# Patient Record
Sex: Female | Born: 1965 | Race: Black or African American | Hispanic: No | Marital: Single | Smoking: Never smoker
Health system: Southern US, Community
[De-identification: ages and names within clinical notes are randomized; demographics above are authoritative.]

## PROBLEM LIST (undated history)

## (undated) DIAGNOSIS — K219 Gastro-esophageal reflux disease without esophagitis: Secondary | ICD-10-CM

## (undated) DIAGNOSIS — F419 Anxiety disorder, unspecified: Secondary | ICD-10-CM

## (undated) DIAGNOSIS — E119 Type 2 diabetes mellitus without complications: Secondary | ICD-10-CM

## (undated) DIAGNOSIS — T8859XA Other complications of anesthesia, initial encounter: Secondary | ICD-10-CM

## (undated) DIAGNOSIS — G4730 Sleep apnea, unspecified: Secondary | ICD-10-CM

## (undated) DIAGNOSIS — R51 Headache: Secondary | ICD-10-CM

## (undated) DIAGNOSIS — J988 Other specified respiratory disorders: Secondary | ICD-10-CM

## (undated) DIAGNOSIS — T4145XA Adverse effect of unspecified anesthetic, initial encounter: Secondary | ICD-10-CM

## (undated) DIAGNOSIS — F99 Mental disorder, not otherwise specified: Secondary | ICD-10-CM

## (undated) DIAGNOSIS — I10 Essential (primary) hypertension: Secondary | ICD-10-CM

## (undated) HISTORY — PX: SHOULDER SURGERY: SHX246

## (undated) HISTORY — PX: DILATION AND CURETTAGE OF UTERUS: SHX78

## (undated) HISTORY — PX: ABDOMINAL HYSTERECTOMY: SHX81

---

## 2000-02-28 ENCOUNTER — Other Ambulatory Visit: Admission: RE | Admit: 2000-02-28 | Discharge: 2000-02-28 | Payer: Self-pay | Admitting: Obstetrics and Gynecology

## 2000-11-20 ENCOUNTER — Ambulatory Visit (HOSPITAL_COMMUNITY): Admission: RE | Admit: 2000-11-20 | Discharge: 2000-11-20 | Payer: Self-pay | Admitting: Family Medicine

## 2000-11-20 ENCOUNTER — Encounter: Payer: Self-pay | Admitting: Family Medicine

## 2000-12-14 ENCOUNTER — Other Ambulatory Visit: Admission: RE | Admit: 2000-12-14 | Discharge: 2000-12-14 | Payer: Self-pay | Admitting: Obstetrics & Gynecology

## 2001-04-18 ENCOUNTER — Encounter: Payer: Self-pay | Admitting: Family Medicine

## 2001-04-18 ENCOUNTER — Encounter: Admission: RE | Admit: 2001-04-18 | Discharge: 2001-04-18 | Payer: Self-pay | Admitting: Family Medicine

## 2001-09-19 ENCOUNTER — Encounter (INDEPENDENT_AMBULATORY_CARE_PROVIDER_SITE_OTHER): Payer: Self-pay | Admitting: *Deleted

## 2001-09-20 ENCOUNTER — Inpatient Hospital Stay (HOSPITAL_COMMUNITY): Admission: RE | Admit: 2001-09-20 | Discharge: 2001-09-23 | Payer: Self-pay | Admitting: Obstetrics & Gynecology

## 2001-09-20 ENCOUNTER — Encounter: Payer: Self-pay | Admitting: Obstetrics & Gynecology

## 2001-10-24 ENCOUNTER — Other Ambulatory Visit: Admission: RE | Admit: 2001-10-24 | Discharge: 2001-10-24 | Payer: Self-pay | Admitting: Obstetrics & Gynecology

## 2002-10-09 HISTORY — PX: MYOMECTOMY: SHX85

## 2003-08-14 ENCOUNTER — Other Ambulatory Visit: Admission: RE | Admit: 2003-08-14 | Discharge: 2003-08-14 | Payer: Self-pay | Admitting: Obstetrics & Gynecology

## 2004-02-26 ENCOUNTER — Ambulatory Visit (HOSPITAL_BASED_OUTPATIENT_CLINIC_OR_DEPARTMENT_OTHER): Admission: RE | Admit: 2004-02-26 | Discharge: 2004-02-26 | Payer: Self-pay | Admitting: Family Medicine

## 2004-03-21 ENCOUNTER — Encounter: Admission: RE | Admit: 2004-03-21 | Discharge: 2004-03-21 | Payer: Self-pay | Admitting: Family Medicine

## 2006-08-03 ENCOUNTER — Encounter: Admission: RE | Admit: 2006-08-03 | Discharge: 2006-08-03 | Payer: Self-pay | Admitting: Internal Medicine

## 2006-09-21 ENCOUNTER — Encounter (INDEPENDENT_AMBULATORY_CARE_PROVIDER_SITE_OTHER): Payer: Self-pay | Admitting: Specialist

## 2006-09-21 ENCOUNTER — Ambulatory Visit (HOSPITAL_COMMUNITY): Admission: RE | Admit: 2006-09-21 | Discharge: 2006-09-21 | Payer: Self-pay | Admitting: Obstetrics & Gynecology

## 2008-06-11 ENCOUNTER — Ambulatory Visit (HOSPITAL_COMMUNITY): Admission: RE | Admit: 2008-06-11 | Discharge: 2008-06-11 | Payer: Self-pay | Admitting: Obstetrics & Gynecology

## 2009-07-02 ENCOUNTER — Ambulatory Visit (HOSPITAL_COMMUNITY): Admission: RE | Admit: 2009-07-02 | Discharge: 2009-07-02 | Payer: Self-pay | Admitting: Obstetrics & Gynecology

## 2010-07-04 ENCOUNTER — Ambulatory Visit (HOSPITAL_COMMUNITY): Admission: RE | Admit: 2010-07-04 | Discharge: 2010-07-04 | Payer: Self-pay | Admitting: Obstetrics & Gynecology

## 2010-12-08 ENCOUNTER — Emergency Department (HOSPITAL_COMMUNITY): Payer: Self-pay

## 2010-12-08 ENCOUNTER — Emergency Department (HOSPITAL_COMMUNITY)
Admission: EM | Admit: 2010-12-08 | Discharge: 2010-12-09 | Disposition: A | Discharge status: Home / Self Care | Payer: Self-pay | Attending: Emergency Medicine | Admitting: Emergency Medicine

## 2010-12-08 DIAGNOSIS — F319 Bipolar disorder, unspecified: Secondary | ICD-10-CM | POA: Insufficient documentation

## 2010-12-08 DIAGNOSIS — R079 Chest pain, unspecified: Secondary | ICD-10-CM | POA: Insufficient documentation

## 2010-12-08 DIAGNOSIS — I10 Essential (primary) hypertension: Secondary | ICD-10-CM | POA: Insufficient documentation

## 2010-12-08 LAB — COMPREHENSIVE METABOLIC PANEL
ALT: 20 U/L (ref 0–35)
AST: 18 U/L (ref 0–37)
Alkaline Phosphatase: 58 U/L (ref 39–117)
CO2: 25 mEq/L (ref 19–32)
Chloride: 102 mEq/L (ref 96–112)
GFR calc Af Amer: >60 mL/min (ref >60)
GFR calc non Af Amer: >60 mL/min (ref >60)
Sodium: 136 mEq/L (ref 135–145)
Total Bilirubin: 0.4 mg/dL (ref 0.3–1.2)

## 2010-12-08 LAB — CBC
HCT: 38.2 % (ref 36.0–46.0)
Hemoglobin: 13.2 g/dL (ref 12.0–15.0)
MCH: 29.5 pg (ref 26.0–34.0)
MCHC: 34.6 g/dL (ref 30.0–36.0)

## 2010-12-08 LAB — POCT CARDIAC MARKERS

## 2011-02-24 NOTE — Discharge Summary (Signed)
[facility]  Patient:    Tracy Barry, Tracy Barry Visit Number: [id number] MRN: [id number]          Service Type: GYN Location: [id number] Attending Physician:  Genia Del Dictated by:   Genia Del, M.D. Admit Date:  09/19/2001 Discharge Date: 09/23/2001                             Discharge Summary  DATE OF BIRTH:  07-12-2066  ADMISSION DIAGNOSIS:  Symptomatic uterine myomas with menorrhagia.  DISCHARGE DIAGNOSES: 1. Symptomatic uterine myomas with menorrhagia. 2. Postoperative pulmonary edema secondary to mild adult respiratory distress    syndrome of uncertain etiology.  HOSPITAL COURSE:  The patient was admitted for surgery on September 19, 2001. She had symptomatic uterine myomas with menorrhagia.  Her hemoglobin at the time of surgery was 11.2.  She had been on Depo-Lupron x4 doses.  Endometrial biopsy was negative.  She had a Pfannenstiel incision and myomectomies were performed x3.  There was no complication at the time of surgery.  The estimated blood loss was 100 cc.  Both ovaries and tubes were normal in appearance.  The uterus was otherwise normal.  On postoperative day #1, the patient was doing very well, the pain was well controlled p.o., and she was ambulating with normal urine output.  She was afebrile with stable blood pressures, and her examination was unremarkable with bowel sounds present, and intact incision.  Hemoglobin was 10.2 postoperatively.  On that same day, later on in the afternoon, the patient was complaining of difficulty breathing.  Upon nurse exam, she was found to have rales and rhonchi, especially at the left lower lobe.  Her respiratory rate was 32, O2 saturations were only 50 to 60% on room air, and increased to 90% with 5 L of O2.  She had an M.D. evaluation, and an arterial blood gas with O2 5 L per minute was done revealing a PO2 at 46, PCO2 at 49, pH at 7.39, bicarbonate 429.3, and base excess  4.2.  O2 saturations were in the 85 to 90% range. Diagnosis of probable pulmonary edema was made, and the patient was given Lasix 20 mg IV.  Chest x-ray was requested, as well as an EKG, and the patient was transferred to the adult intensive care unit.  She responded well to Lasix, and that was repeated with a second dose at 20 mg IV.  On postoperative day #2, the patient was feeling better, but she still required 10 L per minute of O2.  She had no cardiovascular complaints, and did mention a viral upper respiratory tract syndrome that was improving recently, but had been going on for about four weeks before.  She also mentioned that she had secondary cigarette smoke at home.  On exam, her O2 saturations were 97 to 99% with oxygen, 82.8% without.  Vital signs were stable.  Lungs were showing good air entry bilaterally, but crackles throughout, and still more on the left side. The chest x-ray had shown interstitial opacities, probable mild to moderate pulmonary edema, left more than right.  EKG was normal.  Diuresis was excellent with ins and outs at -2, 5, 70 cc.  A consultation with an intensive care specialist, Dr. Sherene Sires, was requested.  The patient was seen on postoperative day #3, and confirmation of a probable pulmonary edema secondary to mild ARDS was posed.  An echocardiogram was requested which later came back negative.  On postoperative day #3, the patient was much improved, and was doing very well on postoperative day #4.  She then was tolerating room air very good with saturations at 93 to 96%.  Lungs showed no crackles anymore, and the impression was of complete resolution of postoperative pulmonary edema.  The patient was discharged home with advise, and would follow up with Dr. Sherene Sires in one week.  Postoperative visit at [facility] was planned at 3 weeks.  Tylox was prescribed p.r.n. Dictated by:   Genia Del, M.D. Attending Physician:  Genia Del DD:   10/21/01 TD:  10/22/01 Job: [id number]

## 2011-02-24 NOTE — Op Note (Signed)
[name], Tracy Barry             ACCOUNT NO.:  [id number]   MEDICAL RECORD NO.:  [id number]          PATIENT TYPE:  AMB   LOCATION:  SDC                           FACILITY:  [facility]   PHYSICIAN:  Genia Del, M.D.DATE OF BIRTH:  Oct 09, 1966   DATE OF PROCEDURE:  09/21/2006  DATE OF DISCHARGE:                               OPERATIVE REPORT   PREOPERATIVE DIAGNOSIS:  Menometrorrhagia with submucosal myoma.   POSTOP DIAGNOSIS:  Menometrorrhagia with submucosal myoma.   PROCEDURE:  Hysteroscopy resection of myoma and dilatation and  curettage.   SURGEON:  Dr. Genia Del and no assistant.   PROCEDURE:  Under general anesthesia with endotracheal intubation the  patient is in lithotomy position.  She is prepped with Betadine on the  suprapubic vulvar and vaginal areas.  The bladder was catheterized and  the patient is draped as usual.  The vaginal exam reveals an anteverted  uterus, slightly increased in volume, mobile, no adnexal masses.  The  speculum was inserted in the vagina and the anterior lip of the cervix  was grasped with a tenaculum.  A paracervical block is done at 4 o'clock  and 8 o'clock with Nesacaine 1% a total of 20 mL. The hysterometry at 9  cm. Dilatation of the cervix with Hegar dilators up to #35 without  difficulty.  We then inserted the operative hysteroscope with the double  loop. We visualize the intrauterine cavity. Two ostia are well seen and  pictures are taken.  A submucosal myoma is present on the left mid  lateral posterior aspect of the uterus.  It measured about 2.5 cm, is  resected with a double loop completely. We then proceed with the  intrauterine curettage with a sharp curette. A curettage was done  systematically on all intrauterine surfaces.  The specimen is sent  separately as endometrial curettings.  We then go back with the  hysteroscope, visualize the intrauterine cavity which now appears  normal. Pictures are taken.  Hemostasis is  adequate. Cauterization was  used at the base of the myoma to complete it. We then removed all  instruments. Hemostasis is adequate on the cervix.  The estimated blood  loss was minimal.  The fluid deficit was 175 mL.  No complications  occurred and the patient was brought to recovery room in good stable  status.      Genia Del, M.D.  Electronically Signed     ML/MEDQ  D:  09/21/2006  T:  09/21/2006  Job:  [id number]

## 2011-02-24 NOTE — Op Note (Signed)
[facility]  Patient:    Tracy Barry, Tracy Barry Visit Number: [id number] MRN: [id number]          Service Type: DSU Location: 9300 [id number] Attending Physician:  Genia Del Dictated by:   Genia Del, M.D. Proc. Date: 09/19/01 Admit Date:  09/19/2001                             Operative Report  DATE OF BIRTH:                [birth year]  PREOPERATIVE DIAGNOSIS:       Symptomatic uterine myoma with menorrhagia,                               desires preserved fertility.  POSTOPERATIVE DIAGNOSIS:      Symptomatic uterine myoma with menorrhagia,                               desires preserved fertility.  OPERATION:                    Myomectomies x 3 by laparotomy.  SURGEON:                      Genia Del, M.D.  ASSISTANT:                    Lenoard Aden, M.D.  ANESTHESIOLOGIST:             Dr. _________.  ANESTHESIA:                   General.  DESCRIPTION OF PROCEDURE:     Under general anesthesia with endotracheal intubation with the patient in the decubitus dorsal position.  She is prepped with Betadine in the abdominal, suprapubic, vulvar, and vaginal area.  The bladder catheter was inserted and the patient is draped as usual.  A Pfannenstiel incision was performed with a scalpel.  The aponeurosis was opened transversely with the Mayo scissors.  The parietal peritoneum was opened longitudinally.  We then explored the pelvic cavity.  The uterus showed an anterior fundal intramural myoma measuring about 5 cm.  A posterior pedunculated subserosal myoma measuring about 2 cm is present as well as a third myoma on the left anterior uterus, pedunculated and subserosal myoma measuring about 2.5 cm.  The bowels are retracted upward with laps and the Balfour retractor is inserted.  We injected the vasopressin 1:200 at the base of the pedunculated myoma.  We then excised them by electrocauterization at the base.   Hemostasis is adequate at both levels.  We then injected vasopressin 1:200 at the level of the anterior fundus of the uterus.  We proceeded with an incision at this level with the electrocautery.  We then proceeded with excision of the intramural myoma using the Metzenbaum scissors to dissect until we reached the base of the myoma.  At that level, the Masterson clamp is used to clamp the vessel, irrigating the myoma.  We then sutured with a Vicryl 0.  The three myomas are sent to pathology.  The uterine cavity was not entered, but the myometrium was completely incised down to the endometrium.  We then closed the base with interrupted Vicryl 0. Hemostasis was adequate at that level.  We then closed the superficial part of the myometrium with a baseball-type suture, inverting the suture line with a Monocryl 2-0.  We confirmed hemostasis at all levels.  An additional suture with Monocryl 2-0 is applied at the fundus to complete hemostasis.  We then applied Interceed at all levels of myomectomies of the uterus.  Both ovaries were normal in appearance and size.  Both tubes were normal in appearance and size.  A small paratubal cyst was removed on the left side.  The Interceed was applied after irrigating the pelvic cavity and suctioning.  We then removed the laps and the retractor.  We verified hemostasis at the level of the recti muscles.  It is adequate.  We closed the aponeurosis by two half running sutures with Vicryl 0.  Hemostasis at the level of the adipose tissue was completed with the electrocautery.  The skin is reapproximated with staples.  Note, that the skin was infiltrated with Marcaine 0.25% at the beginning of the surgery, 10 cc.  A dry dressing was applied.  The estimated blood loss was 100 cc.  Count of instruments and sponges was complete x 2.  No complications occurred and the patient was sent to the recovery room in good status. Dictated by:   Genia Del,  M.D. Attending Physician:  Genia Del DD:  09/19/01 TD:  09/19/01 Job: [id number]

## 2011-06-23 ENCOUNTER — Other Ambulatory Visit (HOSPITAL_COMMUNITY): Payer: Self-pay | Admitting: Obstetrics & Gynecology

## 2011-06-23 DIAGNOSIS — Z1231 Encounter for screening mammogram for malignant neoplasm of breast: Secondary | ICD-10-CM

## 2011-07-07 ENCOUNTER — Ambulatory Visit (HOSPITAL_COMMUNITY)
Admission: RE | Admit: 2011-07-07 | Discharge: 2011-07-07 | Disposition: A | Discharge status: Home / Self Care | Payer: 59 | Source: Ambulatory Visit | Attending: Obstetrics & Gynecology | Admitting: Obstetrics & Gynecology

## 2011-07-07 DIAGNOSIS — Z1231 Encounter for screening mammogram for malignant neoplasm of breast: Secondary | ICD-10-CM

## 2012-06-11 ENCOUNTER — Other Ambulatory Visit (HOSPITAL_COMMUNITY): Payer: Self-pay | Admitting: Obstetrics & Gynecology

## 2012-06-11 DIAGNOSIS — Z1231 Encounter for screening mammogram for malignant neoplasm of breast: Secondary | ICD-10-CM

## 2012-07-08 ENCOUNTER — Ambulatory Visit (HOSPITAL_COMMUNITY)
Admission: RE | Admit: 2012-07-08 | Discharge: 2012-07-08 | Disposition: A | Discharge status: Home / Self Care | Payer: 59 | Source: Ambulatory Visit | Attending: Obstetrics & Gynecology | Admitting: Obstetrics & Gynecology

## 2012-07-08 DIAGNOSIS — Z1231 Encounter for screening mammogram for malignant neoplasm of breast: Secondary | ICD-10-CM | POA: Insufficient documentation

## 2012-09-20 ENCOUNTER — Encounter: Admission: RE | Payer: Self-pay | Source: Ambulatory Visit

## 2012-09-20 ENCOUNTER — Ambulatory Visit: Admission: RE | Admit: 2012-09-20 | Payer: 59 | Source: Ambulatory Visit | Admitting: Obstetrics & Gynecology

## 2012-09-20 SURGERY — ROBOTIC ASSISTED TOTAL HYSTERECTOMY
Anesthesia: General

## 2012-10-11 ENCOUNTER — Encounter (HOSPITAL_COMMUNITY): Payer: Self-pay | Admitting: Pharmacist

## 2012-10-11 ENCOUNTER — Other Ambulatory Visit: Payer: Self-pay | Admitting: Obstetrics & Gynecology

## 2012-10-14 ENCOUNTER — Encounter (HOSPITAL_COMMUNITY)
Admission: RE | Admit: 2012-10-14 | Discharge: 2012-10-14 | Disposition: A | Discharge status: Home / Self Care | Payer: Managed Care, Other (non HMO) | Source: Ambulatory Visit | Attending: Obstetrics & Gynecology | Admitting: Obstetrics & Gynecology

## 2012-10-14 ENCOUNTER — Other Ambulatory Visit: Payer: Self-pay

## 2012-10-14 ENCOUNTER — Encounter (HOSPITAL_COMMUNITY): Payer: Self-pay

## 2012-10-14 HISTORY — DX: Adverse effect of unspecified anesthetic, initial encounter: T41.45XA

## 2012-10-14 HISTORY — DX: Headache: R51

## 2012-10-14 HISTORY — DX: Other complications of anesthesia, initial encounter: T88.59XA

## 2012-10-14 HISTORY — DX: Sleep apnea, unspecified: G47.30

## 2012-10-14 HISTORY — DX: Essential (primary) hypertension: I10

## 2012-10-14 HISTORY — DX: Anxiety disorder, unspecified: F41.9

## 2012-10-14 HISTORY — DX: Type 2 diabetes mellitus without complications: E11.9

## 2012-10-14 HISTORY — DX: Gastro-esophageal reflux disease without esophagitis: K21.9

## 2012-10-14 HISTORY — DX: Other specified respiratory disorders: J98.8

## 2012-10-14 LAB — BASIC METABOLIC PANEL
Calcium: 9.8 mg/dL (ref 8.4–10.5)
Creatinine, Ser: 0.82 mg/dL (ref 0.50–1.10)
GFR calc Af Amer: >90 mL/min (ref >90)

## 2012-10-14 LAB — CBC
MCH: 29.2 pg (ref 26.0–34.0)
MCV: 90.8 fL (ref 78.0–100.0)
Platelets: 304 10*3/uL (ref 150–400)
RDW: 14.4 % (ref 11.5–15.5)

## 2012-10-14 NOTE — Progress Notes (Signed)
EKG performed for per preop protocol, Dr Rodman Pickle shown with old EKG for comparison, no new orders received at present. Pt also requesting to speak with anesthesiologist for questions, Dr Rodman Pickle in to see pt and discuss upcoming surgery. Cleared for surgery by Dr Rodman Pickle.

## 2012-10-14 NOTE — Patient Instructions (Addendum)
GENERAL PRE-OPERATIVE PATIENT INSTRUCTIONS   Your procedure is scheduled on: Thursday, January 9th  Enter through the Main Entrance of [facility] at:6:00am Pick up the phone at the desk and dial [phone] and inform us of your arrival.  Please call this number if you have any problems the morning of surgery: [phone]  Take your Hydrochlorthiazide and Lamotrigine, clonazepam with sips of water morning of surgery. Hold your Metformin Wednesday evening dose and Thursday morning dose Remember: Do not eat or drink anything after midnight on Wednesday, January 8th  Do not wear jewelry, make-up, or FINGER nail polish No metal in your hair or on your body. Do not wear lotions, powders, perfumes. You may wear deodorant. Please use your CHG wash as directed prior to surgery. Do not shave anywhere for at least 12 hours prior to first CHG shower. Do not bring valuables to the hospital.  Leave suitcase in the car.  After Surgery it may be brought to your room. For patients being admitted to the hospital, checkout time is 11:00am the day of discharge.   Patient signature______________________________________________________________

## 2012-10-17 ENCOUNTER — Ambulatory Visit (HOSPITAL_COMMUNITY)
Admission: RE | Admit: 2012-10-17 | Discharge: 2012-10-18 | Disposition: A | Discharge status: Home / Self Care | Payer: Managed Care, Other (non HMO) | Source: Ambulatory Visit | Attending: Obstetrics & Gynecology | Admitting: Obstetrics & Gynecology

## 2012-10-17 ENCOUNTER — Encounter (HOSPITAL_COMMUNITY): Payer: Self-pay | Admitting: Anesthesiology

## 2012-10-17 ENCOUNTER — Encounter (HOSPITAL_COMMUNITY): Payer: Self-pay

## 2012-10-17 ENCOUNTER — Ambulatory Visit (HOSPITAL_COMMUNITY): Payer: Managed Care, Other (non HMO) | Admitting: Anesthesiology

## 2012-10-17 ENCOUNTER — Encounter (HOSPITAL_COMMUNITY)
Admission: RE | Disposition: A | Discharge status: Home / Self Care | Payer: Self-pay | Source: Ambulatory Visit | Attending: Obstetrics & Gynecology

## 2012-10-17 DIAGNOSIS — Z90710 Acquired absence of both cervix and uterus: Secondary | ICD-10-CM | POA: Diagnosis present

## 2012-10-17 DIAGNOSIS — Z01818 Encounter for other preprocedural examination: Secondary | ICD-10-CM | POA: Insufficient documentation

## 2012-10-17 DIAGNOSIS — N736 Female pelvic peritoneal adhesions (postinfective): Secondary | ICD-10-CM | POA: Insufficient documentation

## 2012-10-17 DIAGNOSIS — Z01812 Encounter for preprocedural laboratory examination: Secondary | ICD-10-CM | POA: Insufficient documentation

## 2012-10-17 DIAGNOSIS — D252 Subserosal leiomyoma of uterus: Secondary | ICD-10-CM | POA: Insufficient documentation

## 2012-10-17 DIAGNOSIS — N920 Excessive and frequent menstruation with regular cycle: Secondary | ICD-10-CM | POA: Insufficient documentation

## 2012-10-17 HISTORY — PX: ROBOTIC ASSISTED TOTAL HYSTERECTOMY: SHX6085

## 2012-10-17 LAB — CBC
Hemoglobin: 11.5 g/dL — ABNORMAL LOW (ref 12.0–15.0)
MCH: 29.9 pg (ref 26.0–34.0)
MCHC: 33.6 g/dL (ref 30.0–36.0)
Platelets: 277 10*3/uL (ref 150–400)
RDW: 14.1 % (ref 11.5–15.5)

## 2012-10-17 LAB — MRSA CULTURE

## 2012-10-17 LAB — TYPE AND SCREEN
ABO/RH(D): A POS
Antibody Screen: NEGATIVE

## 2012-10-17 LAB — GLUCOSE, CAPILLARY
Glucose-Capillary: 102 mg/dL — ABNORMAL HIGH (ref 70–99)
Glucose-Capillary: 94 mg/dL (ref 70–99)

## 2012-10-17 LAB — ABO/RH: ABO/RH(D): A POS

## 2012-10-17 SURGERY — ROBOTIC ASSISTED TOTAL HYSTERECTOMY
Anesthesia: General | Site: Abdomen | Wound class: Clean Contaminated

## 2012-10-17 MED ORDER — FENTANYL CITRATE 0.05 MG/ML IJ SOLN
INTRAMUSCULAR | Status: AC
  Filled 2012-10-17: qty 5

## 2012-10-17 MED ORDER — BUPIVACAINE HCL (PF) 0.25 % IJ SOLN
INTRAMUSCULAR | Status: DC | PRN
  Administered 2012-10-17: 19 mL

## 2012-10-17 MED ORDER — NEOSTIGMINE METHYLSULFATE 1 MG/ML IJ SOLN
INTRAMUSCULAR | Status: DC | PRN
  Administered 2012-10-17: 3 mg via INTRAVENOUS

## 2012-10-17 MED ORDER — ACETAMINOPHEN 10 MG/ML IV SOLN
INTRAVENOUS | Status: AC
  Filled 2012-10-17: qty 100

## 2012-10-17 MED ORDER — OXYCODONE-ACETAMINOPHEN 5-325 MG PO TABS
1.0000 | ORAL_TABLET | ORAL | Status: DC | PRN
  Administered 2012-10-17: 2 via ORAL
  Administered 2012-10-18: 1 via ORAL
  Administered 2012-10-18 (×2): 2 via ORAL
  Filled 2012-10-17: qty 2
  Filled 2012-10-17: qty 1
  Filled 2012-10-17 (×2): qty 2

## 2012-10-17 MED ORDER — METFORMIN HCL 500 MG PO TABS
500.0000 mg | ORAL_TABLET | Freq: Two times a day (BID) | ORAL | Status: DC
Start: 2012-10-17 — End: 2012-10-18
  Administered 2012-10-17 – 2012-10-18 (×2): 500 mg via ORAL
  Filled 2012-10-17 (×2): qty 1

## 2012-10-17 MED ORDER — GLYCOPYRROLATE 0.2 MG/ML IJ SOLN
INTRAMUSCULAR | Status: DC | PRN
  Administered 2012-10-17: 0.4 mg via INTRAVENOUS

## 2012-10-17 MED ORDER — OXYCODONE-ACETAMINOPHEN 7.5-325 MG PO TABS: 1.0000 | ORAL_TABLET | Freq: Four times a day (QID) | ORAL | Status: DC | PRN

## 2012-10-17 MED ORDER — LIDOCAINE HCL (CARDIAC) 20 MG/ML IV SOLN
INTRAVENOUS | Status: AC
  Filled 2012-10-17: qty 5

## 2012-10-17 MED ORDER — LABETALOL HCL 5 MG/ML IV SOLN
INTRAVENOUS | Status: DC | PRN
  Administered 2012-10-17 (×2): 5 mg via INTRAVENOUS

## 2012-10-17 MED ORDER — ONDANSETRON HCL 4 MG/2ML IJ SOLN
INTRAMUSCULAR | Status: DC | PRN
  Administered 2012-10-17: 4 mg via INTRAVENOUS

## 2012-10-17 MED ORDER — PROPOFOL 10 MG/ML IV EMUL
INTRAVENOUS | Status: AC
  Filled 2012-10-17: qty 20

## 2012-10-17 MED ORDER — CLONAZEPAM 0.5 MG PO TABS
1.0000 mg | ORAL_TABLET | Freq: Every day | ORAL | Status: DC
  Administered 2012-10-18: 1 mg via ORAL
  Filled 2012-10-17: qty 2

## 2012-10-17 MED ORDER — MEPERIDINE HCL 25 MG/ML IJ SOLN: 6.2500 mg | INTRAMUSCULAR | Status: DC | PRN

## 2012-10-17 MED ORDER — HYDROMORPHONE HCL PF 1 MG/ML IJ SOLN
INTRAMUSCULAR | Status: AC
  Filled 2012-10-17: qty 1

## 2012-10-17 MED ORDER — CEFOXITIN SODIUM 2 G IV SOLR
2.0000 g | Freq: Once | INTRAVENOUS | Status: AC
  Administered 2012-10-17: 2 g via INTRAVENOUS
  Filled 2012-10-17: qty 2

## 2012-10-17 MED ORDER — HYDROCHLOROTHIAZIDE 12.5 MG PO CAPS
12.5000 mg | ORAL_CAPSULE | Freq: Every day | ORAL | Status: DC
  Filled 2012-10-17: qty 1

## 2012-10-17 MED ORDER — FENTANYL CITRATE 0.05 MG/ML IJ SOLN
INTRAMUSCULAR | Status: AC
  Filled 2012-10-17: qty 2

## 2012-10-17 MED ORDER — ROCURONIUM BROMIDE 100 MG/10ML IV SOLN
INTRAVENOUS | Status: DC | PRN
  Administered 2012-10-17: 20 mg via INTRAVENOUS
  Administered 2012-10-17: 5 mg via INTRAVENOUS
  Administered 2012-10-17: 50 mg via INTRAVENOUS
  Administered 2012-10-17 (×2): 20 mg via INTRAVENOUS
  Administered 2012-10-17: 10 mg via INTRAVENOUS

## 2012-10-17 MED ORDER — FENTANYL CITRATE 0.05 MG/ML IJ SOLN
INTRAMUSCULAR | Status: DC | PRN
  Administered 2012-10-17: 100 ug via INTRAVENOUS
  Administered 2012-10-17 (×4): 50 ug via INTRAVENOUS
  Administered 2012-10-17: 100 ug via INTRAVENOUS

## 2012-10-17 MED ORDER — HYDROMORPHONE HCL PF 1 MG/ML IJ SOLN
1.0000 mg | INTRAMUSCULAR | Status: DC | PRN
  Administered 2012-10-17 – 2012-10-18 (×2): 1 mg via INTRAVENOUS
  Filled 2012-10-17 (×2): qty 1

## 2012-10-17 MED ORDER — BUPIVACAINE HCL (PF) 0.25 % IJ SOLN
INTRAMUSCULAR | Status: AC
  Filled 2012-10-17: qty 30

## 2012-10-17 MED ORDER — ONDANSETRON HCL 4 MG/2ML IJ SOLN
INTRAMUSCULAR | Status: AC
  Filled 2012-10-17: qty 2

## 2012-10-17 MED ORDER — NEOSTIGMINE METHYLSULFATE 1 MG/ML IJ SOLN
INTRAMUSCULAR | Status: AC
  Filled 2012-10-17: qty 1

## 2012-10-17 MED ORDER — MIDAZOLAM HCL 2 MG/2ML IJ SOLN
INTRAMUSCULAR | Status: AC
  Filled 2012-10-17: qty 2

## 2012-10-17 MED ORDER — ARTIFICIAL TEARS OP OINT
TOPICAL_OINTMENT | OPHTHALMIC | Status: AC
  Filled 2012-10-17: qty 3.5

## 2012-10-17 MED ORDER — FENTANYL CITRATE 0.05 MG/ML IJ SOLN
25.0000 ug | INTRAMUSCULAR | Status: DC | PRN
  Administered 2012-10-17 (×5): 50 ug via INTRAVENOUS

## 2012-10-17 MED ORDER — KETOROLAC TROMETHAMINE 30 MG/ML IJ SOLN
INTRAMUSCULAR | Status: AC
  Administered 2012-10-17: 30 mg via INTRAVENOUS
  Filled 2012-10-17: qty 1

## 2012-10-17 MED ORDER — LACTATED RINGERS IR SOLN
Status: DC | PRN
  Administered 2012-10-17: 3000 mL

## 2012-10-17 MED ORDER — IBUPROFEN 600 MG PO TABS
600.0000 mg | ORAL_TABLET | Freq: Four times a day (QID) | ORAL | Status: DC | PRN
  Administered 2012-10-18: 600 mg via ORAL
  Filled 2012-10-17: qty 1

## 2012-10-17 MED ORDER — LACTATED RINGERS IV SOLN
INTRAVENOUS | Status: DC
  Administered 2012-10-17 – 2012-10-18 (×2): via INTRAVENOUS

## 2012-10-17 MED ORDER — MIDAZOLAM HCL 5 MG/5ML IJ SOLN
INTRAMUSCULAR | Status: DC | PRN
  Administered 2012-10-17: 2 mg via INTRAVENOUS

## 2012-10-17 MED ORDER — LABETALOL HCL 5 MG/ML IV SOLN
INTRAVENOUS | Status: AC
  Filled 2012-10-17: qty 4

## 2012-10-17 MED ORDER — LACTATED RINGERS IV SOLN
INTRAVENOUS | Status: DC
  Administered 2012-10-17: 09:00:00 via INTRAVENOUS
  Administered 2012-10-17: 50 mL/h via INTRAVENOUS
  Administered 2012-10-17: 15:00:00 via INTRAVENOUS

## 2012-10-17 MED ORDER — GLYCOPYRROLATE 0.2 MG/ML IJ SOLN
INTRAMUSCULAR | Status: AC
  Filled 2012-10-17: qty 2

## 2012-10-17 MED ORDER — PROPOFOL 10 MG/ML IV EMUL
INTRAVENOUS | Status: DC | PRN
  Administered 2012-10-17: 200 mg via INTRAVENOUS
  Administered 2012-10-17 (×4): 50 mg via INTRAVENOUS

## 2012-10-17 MED ORDER — LIDOCAINE HCL (CARDIAC) 20 MG/ML IV SOLN
INTRAVENOUS | Status: DC | PRN
  Administered 2012-10-17: 80 mg via INTRAVENOUS

## 2012-10-17 MED ORDER — HYDROMORPHONE HCL PF 1 MG/ML IJ SOLN
INTRAMUSCULAR | Status: DC | PRN
  Administered 2012-10-17: 1 mg via INTRAVENOUS

## 2012-10-17 MED ORDER — ONDANSETRON HCL 4 MG/2ML IJ SOLN: 4.0000 mg | Freq: Once | INTRAMUSCULAR | Status: DC | PRN

## 2012-10-17 MED ORDER — ROCURONIUM BROMIDE 50 MG/5ML IV SOLN
INTRAVENOUS | Status: AC
  Filled 2012-10-17: qty 1

## 2012-10-17 MED ORDER — LAMOTRIGINE 100 MG PO TABS
150.0000 mg | ORAL_TABLET | Freq: Two times a day (BID) | ORAL | Status: DC
  Administered 2012-10-17 – 2012-10-18 (×2): 150 mg via ORAL
  Filled 2012-10-17 (×2): qty 1.5

## 2012-10-17 MED ORDER — KETOROLAC TROMETHAMINE 30 MG/ML IJ SOLN
15.0000 mg | Freq: Once | INTRAMUSCULAR | Status: AC | PRN
  Administered 2012-10-17: 30 mg via INTRAVENOUS

## 2012-10-17 MED ORDER — STERILE WATER FOR IRRIGATION IR SOLN
Status: DC | PRN
  Administered 2012-10-17: 1000 mL via INTRAVESICAL

## 2012-10-17 MED ORDER — ACETAMINOPHEN 10 MG/ML IV SOLN
1000.0000 mg | Freq: Four times a day (QID) | INTRAVENOUS | Status: DC
  Administered 2012-10-17: 1000 mg via INTRAVENOUS

## 2012-10-17 SURGICAL SUPPLY — 65 items
ADH SKN CLS APL DERMABOND .7 (GAUZE/BANDAGES/DRESSINGS) ×1
BAG URINE DRAINAGE (UROLOGICAL SUPPLIES) ×2 IMPLANT
BARRIER ADHS 3X4 INTERCEED (GAUZE/BANDAGES/DRESSINGS) ×2 IMPLANT
BLADE LAPAROSCOPIC MORCELL KIT (BLADE) IMPLANT
BRR ADH 4X3 ABS CNTRL BYND (GAUZE/BANDAGES/DRESSINGS) ×1
CATH FOLEY 3WAY  5CC 16FR (CATHETERS) ×1
CATH FOLEY 3WAY 5CC 16FR (CATHETERS) ×1 IMPLANT
CLOTH BEACON ORANGE TIMEOUT ST (SAFETY) ×2 IMPLANT
CONT PATH 16OZ SNAP LID 3702 (MISCELLANEOUS) ×2 IMPLANT
COVER MAYO STAND STRL (DRAPES) ×2 IMPLANT
COVER TABLE BACK 60X90 (DRAPES) ×4 IMPLANT
COVER TIP SHEARS 8 DVNC (MISCELLANEOUS) ×1 IMPLANT
COVER TIP SHEARS 8MM DA VINCI (MISCELLANEOUS) ×2
DECANTER SPIKE VIAL GLASS SM (MISCELLANEOUS) ×1 IMPLANT
DERMABOND ADVANCED (GAUZE/BANDAGES/DRESSINGS) ×1
DERMABOND ADVANCED .7 DNX12 (GAUZE/BANDAGES/DRESSINGS) ×2 IMPLANT
DRAPE HUG U DISPOSABLE (DRAPE) ×2 IMPLANT
DRAPE LG THREE QUARTER DISP (DRAPES) ×4 IMPLANT
DRAPE WARM FLUID 44X44 (DRAPE) ×2 IMPLANT
ELECT REM PT RETURN 9FT ADLT (ELECTROSURGICAL) ×2
ELECTRODE REM PT RTRN 9FT ADLT (ELECTROSURGICAL) ×1 IMPLANT
EVACUATOR SMOKE 8.L (FILTER) ×2 IMPLANT
GAUZE VASELINE 3X9 (GAUZE/BANDAGES/DRESSINGS) IMPLANT
GLOVE BIO SURGEON STRL SZ 6.5 (GLOVE) ×5 IMPLANT
GLOVE BIO SURGEON STRL SZ7 (GLOVE) ×4 IMPLANT
GLOVE BIOGEL PI IND STRL 7.0 (GLOVE) ×1 IMPLANT
GLOVE BIOGEL PI INDICATOR 7.0 (GLOVE) ×1
GOWN STRL REIN XL XLG (GOWN DISPOSABLE) ×12 IMPLANT
IV STOPCOCK 4 WAY 40  W/Y SET (IV SOLUTION)
IV STOPCOCK 4 WAY 40 W/Y SET (IV SOLUTION) IMPLANT
KIT ACCESSORY DA VINCI DISP (KITS) ×1
KIT ACCESSORY DVNC DISP (KITS) ×1 IMPLANT
LEGGING LITHOTOMY PAIR STRL (DRAPES) ×2 IMPLANT
NEEDLE HYPO 22GX1.5 SAFETY (NEEDLE) IMPLANT
OCCLUDER COLPOPNEUMO (BALLOONS) ×1 IMPLANT
PACK LAVH (CUSTOM PROCEDURE TRAY) ×2 IMPLANT
PAD PREP 24X48 CUFFED NSTRL (MISCELLANEOUS) ×4 IMPLANT
PLUG CATH AND CAP STER (CATHETERS) ×2 IMPLANT
PROTECTOR NERVE ULNAR (MISCELLANEOUS) ×4 IMPLANT
SET CYSTO W/LG BORE CLAMP LF (SET/KITS/TRAYS/PACK) IMPLANT
SET IRRIG TUBING LAPAROSCOPIC (IRRIGATION / IRRIGATOR) ×4 IMPLANT
SOLUTION ELECTROLUBE (MISCELLANEOUS) ×2 IMPLANT
SUT VIC AB 0 CT1 27 (SUTURE) ×10
SUT VIC AB 0 CT1 27XBRD ANTBC (SUTURE) ×5 IMPLANT
SUT VIC AB 4-0 PS2 27 (SUTURE) ×4 IMPLANT
SUT VICRYL 0 27 CT2 27 ABS (SUTURE) IMPLANT
SUT VICRYL 0 UR6 27IN ABS (SUTURE) ×4 IMPLANT
SUT VLOC 180 0 9IN  GS21 (SUTURE) ×1
SUT VLOC 180 0 9IN GS21 (SUTURE) IMPLANT
SYR 50ML LL SCALE MARK (SYRINGE) ×2 IMPLANT
SYSTEM CONVERTIBLE TROCAR (TROCAR) IMPLANT
TIP RUMI ORANGE 6.7MMX12CM (TIP) IMPLANT
TIP UTERINE 5.1X6CM LAV DISP (MISCELLANEOUS) IMPLANT
TIP UTERINE 6.7X10CM GRN DISP (MISCELLANEOUS) IMPLANT
TIP UTERINE 6.7X6CM WHT DISP (MISCELLANEOUS) IMPLANT
TIP UTERINE 6.7X8CM BLUE DISP (MISCELLANEOUS) ×1 IMPLANT
TOWEL OR 17X24 6PK STRL BLUE (TOWEL DISPOSABLE) ×6 IMPLANT
TROCAR 12M 150ML BLUNT (TROCAR) ×1 IMPLANT
TROCAR DISP BLADELESS 8 DVNC (TROCAR) ×1 IMPLANT
TROCAR DISP BLADELESS 8MM (TROCAR) ×2
TROCAR XCEL 12X100 BLDLESS (ENDOMECHANICALS) ×2 IMPLANT
TROCAR XCEL NON-BLD 5MMX100MML (ENDOMECHANICALS) ×2 IMPLANT
TUBING FILTER THERMOFLATOR (ELECTROSURGICAL) ×3 IMPLANT
WARMER LAPAROSCOPE (MISCELLANEOUS) ×2 IMPLANT
WATER STERILE IRR 1000ML POUR (IV SOLUTION) ×6 IMPLANT

## 2012-10-17 NOTE — Anesthesia Preprocedure Evaluation (Addendum)
Anesthesia Evaluation  Patient identified by MRN, date of birth, ID band Patient awake    Reviewed: Allergy & Precautions, H&P , NPO status , Patient's Chart, lab work & pertinent test results  Airway Mallampati: II TM Distance: >3 FB Neck ROM: full    Dental No notable dental hx. (+) Teeth Intact   Pulmonary  OSA dx in the "past". Does not know her #. Does not have her mask. Had problems with SaO2 after surgery   Pulmonary exam normal       Cardiovascular hypertension, Pt. on medications     Neuro/Psych negative neurological ROS  negative psych ROS   GI/Hepatic Neg liver ROS,   Endo/Other  diabetes, Type obesity  Renal/GU negative Renal ROS     Musculoskeletal negative musculoskeletal ROS (+)   Abdominal (+) + obese,   Peds  Hematology negative hematology ROS (+)   Anesthesia Other Findings   Reproductive/Obstetrics negative OB ROS                           Anesthesia Physical Anesthesia Plan  ASA: III  Anesthesia Plan: General   Post-op Pain Management:    Induction: Intravenous  Airway Management Planned: Oral ETT  Additional Equipment:   Intra-op Plan:   Post-operative Plan: Extubation in OR  Informed Consent: I have reviewed the patients History and Physical, chart, labs and discussed the procedure including the risks, benefits and alternatives for the proposed anesthesia with the patient or authorized representative who has indicated his/her understanding and acceptance.   Dental Advisory Given  Plan Discussed with: CRNA and Surgeon  Anesthesia Plan Comments: (1. Would keep this patient overnight because of comorbidities and dx of OSA.)        Anesthesia Quick Evaluation

## 2012-10-17 NOTE — Discharge Summary (Signed)
  Physician Discharge Summary  Patient ID: Tracy Barry MRN: [id number] DOB/AGE: 1966-06-01 46 y.o.  Admit date: 10/17/2012 Discharge date: 10/17/2012  Admission Diagnoses: Menorrhagia, Uterine Fibroids  Discharge Diagnoses: Menorrhagia, Uterine Fibroids        Active Problems:  * No active hospital problems. *    Discharged Condition: good  Hospital Course: Outpatient  Consults: None  Treatments: surgery: TLH da Vinci with extensive lysis of adhesions  Disposition: 01-Home or Self Care     Medication List     As of 10/17/2012  2:30 PM    ASK your doctor about these medications         ALKA-SELTZER PLUS COLD PO   Take 1 tablet by mouth daily as needed. For cold symptoms      clonazePAM 1 MG tablet   Commonly known as: KLONOPIN   Take 1 mg by mouth daily.      estazolam 2 MG tablet   Commonly known as: PROSOM   Take 2 mg by mouth at bedtime as needed. For sleep. Takes an hour after trazodone if not effective.      hydrochlorothiazide 12.5 MG capsule   Commonly known as: MICROZIDE   Take 12.5 mg by mouth daily.      lamoTRIgine 150 MG tablet   Commonly known as: LAMICTAL   Take 150 mg by mouth 2 (two) times daily.      metFORMIN 500 MG tablet   Commonly known as: GLUCOPHAGE   Take 500 mg by mouth 2 (two) times daily with a meal.      oxyCODONE-acetaminophen 5-325 MG per tablet   Commonly known as: PERCOCET/ROXICET   Take 1 tablet by mouth every 4 (four) hours as needed. For menstrual pain      traZODone 150 MG tablet   Commonly known as: DESYREL   Take 150 mg by mouth at bedtime.           Follow-up Information    Follow up with Brandan Robicheaux,MARIE-LYNE, MD. In 3 weeks.   Contact information:   [address] [place] [address] [phone]          Signed: Genia Del, MD 10/17/2012, 2:30 PM

## 2012-10-17 NOTE — H&P (Signed)
Tracy Barry is an 47 y.o. female  P0  RP:  Menorrhagia with myomas for TLH robotic  Pertinent Gynecological History: Menses: flow is excessive with use of many pads or tampons on heaviest days Contraception: abstinence Blood transfusions: none Sexually transmitted diseases: no past history  Last mammogram: normal  Last pap: normal OB History:  Sp. Abs, P0   Menstrual History:  No LMP recorded.    Past Medical History  Diagnosis Date  . Hypertension   . Complication of anesthesia     decreased sa02 after anesthesia  . Anxiety   . Diabetes mellitus without complication   . Sleep apnea   . Headache   . GERD (gastroesophageal reflux disease)     does not take medication to treat  . Congestion of upper airway     Past Surgical History  Procedure Date  . Myomectomy 2004  . Dilation and curettage of uterus   . Shoulder surgery     No family history on file.  Social History:  reports that she has never smoked. She does not have any smokeless tobacco history on file. She reports that she does not drink alcohol or use illicit drugs.  Allergies: No Known Allergies  Prescriptions prior to admission  Medication Sig Dispense Refill  . Chlorphen-Phenyleph-ASA (ALKA-SELTZER PLUS COLD PO) Take 1 tablet by mouth daily as needed. For cold symptoms      . clonazePAM (KLONOPIN) 1 MG tablet Take 1 mg by mouth daily.      Marland Kitchen estazolam (PROSOM) 2 MG tablet Take 2 mg by mouth at bedtime as needed. For sleep. Takes an hour after trazodone if not effective.      . hydrochlorothiazide (MICROZIDE) 12.5 MG capsule Take 12.5 mg by mouth daily.      Marland Kitchen lamoTRIgine (LAMICTAL) 150 MG tablet Take 150 mg by mouth 2 (two) times daily.      . metFORMIN (GLUCOPHAGE) 500 MG tablet Take 500 mg by mouth 2 (two) times daily with a meal.      . oxyCODONE-acetaminophen (PERCOCET/ROXICET) 5-325 MG per tablet Take 1 tablet by mouth every 4 (four) hours as needed. For menstrual pain      . traZODone  (DESYREL) 150 MG tablet Take 150 mg by mouth at bedtime.         Blood pressure 134/84, pulse 84, temperature 97.9 F (36.6 C), temperature source Oral, resp. rate 18, SpO2 98.00%.   Results for orders placed during the hospital encounter of 10/17/12 (from the past 24 hour(s))  GLUCOSE, CAPILLARY     Status: Normal   Collection Time   10/17/12  7:07 AM      Component Value Range   Glucose-Capillary 91  70 - 99 mg/dL    No results found.  Assessment/Plan: Menorrhagia secondary to myomas for TLH da Vinci.  Surgery and risks reviewed.  Maralee Higuchi,MARIE-LYNE 10/17/2012, 8:39 AM

## 2012-10-17 NOTE — Anesthesia Postprocedure Evaluation (Signed)
Anesthesia Post Note  Patient: Tracy Barry  Procedure(s) Performed: Procedure(s) (LRB): ROBOTIC ASSISTED TOTAL HYSTERECTOMY (N/A)  Anesthesia type: General  Patient location: PACU  Post pain: Pain level controlled  Post assessment: Post-op Vital signs reviewed  Last Vitals:  Filed Vitals:   10/17/12 1545  BP: 144/82  Pulse: 87  Temp:   Resp: 18    Post vital signs: Reviewed  Level of consciousness: sedated  Complications: No apparent anesthesia complications

## 2012-10-17 NOTE — Transfer of Care (Signed)
Immediate Anesthesia Transfer of Care Note  Patient: Tracy Barry  Procedure(s) Performed: Procedure(s) (LRB) with comments: ROBOTIC ASSISTED TOTAL HYSTERECTOMY (N/A) - with bilateral salpingectomy; extensive lysis of adhesions  Patient Location: PACU  Anesthesia Type:General  Level of Consciousness: awake, alert  and oriented  Airway & Oxygen Therapy: Patient connected to nasal cannula oxygen  Post-op Assessment: Report given to PACU RN and Post -op Vital signs reviewed and stable  Post vital signs: stable  Complications: No apparent anesthesia complications

## 2012-10-17 NOTE — Op Note (Signed)
10/17/2012  2:01 PM  PATIENT:  Tracy Barry  47 y.o. female  PRE-OPERATIVE DIAGNOSIS:  Menorrhagia, Uterine Fibroids  POST-OPERATIVE DIAGNOSIS:  menorrhagia, uterine fibroids, extensive bowel and omental adhesions in abdomen and pelvis.  PROCEDURE:  Procedure(s): ROBOTIC ASSISTED TOTAL HYSTERECTOMY AND EXTENSIVE LYSIS OF ADHESIONS  SURGEON:  Surgeon(s): Genia Del, MD Serita Kyle, MD  ASSISTANTS: Dr. Nena Jordan A. Cousins   ANESTHESIA:   general  PROCEDURE:  Under general anesthesia, the patient is in lithotomy position. She is prepped with ChloraPrep on the abdomen and with Betadine on the suprapubic, vulvar and vaginal areas. She is draped as usual. Vaginally the uterus is nodular about 12 cm mobile no adnexal mass felt. The weighted speculum is inserted in the vagina and the anterior lip of the cervix is grasped with a tenaculum. He dilation of the cervix with Hegar dilators.  The hysterometry is at 11 cm so a #10 roomy is used.  The roomy and medium coring are put in place easily.  The tenaculum and speculum were removed. We go to the abdomen. The patient has had a previous abdominoplasty. We infiltrate the subcutaneous tissue with Marcaine one quarter plain at the supraumbilical area.  We make a 1.5 cm incision with a scalpel at that level.  The aponeurosis is opened with Mayo scissors under direct vision and the parietal peritoneum is opened under direct vision with Metzenbaums scissors.  A pursestring stitch is put on the aponeurosis was of Vicryl 0. We insert the Seneca at that level and create a pneumoperitoneum with CO2. The camera is inserted.  We note omental adhesions with the anterior wall of the abdomen. The uterus was completely covered with bowel adhesions.  We used a semicircular configuration for port placement. All sites are infiltrated with Marcaine one quarter plain. An incision is done on all sides with the scalpel. 2 robotic ports are inserted under  direct vision on the right side and one robotic ports on the lower left side. The assistant port a 5 mm port is inserted in the upper left side.  The robot is then docked from the right side easily. Instruments are inserted with the Endo Shears scissor in the first arm, the PK in the second arm and the Prograsp in the third arm.  I go to the console.  We start with extensive lysis of adhesions. With the omentum first and then the bowel adhesions. Overall lysis of adhesions takes up a little over an hour.  As previously mentioned the uterus was completely covered,  the anterior cul-de-sac, posterior cul-de-sac and both ovaries and tubes were stuck in adhesions.  Lysis of adhesions is complete with compete restitution to normal anatomy.  The uterus presents multiple large fibroids with the largest one on the right anterior aspect.  We starts on the right side we cauterized and sectioned the right round ligament.  We then cauterized and sectioned the right mesosalpinx.  We cauterized and sectioned the right utero-ovarian ligament.  And then descended along the right side of the uterus. The anterior peritoneum was opened but not all the way to the anterior part given the large fibroid.  We then proceeded exactly the same way on the left side. We were then able to lyse one remaining adhesion on the anterior left aspect of the uterus, an adhesion with the left anterior wall.  After that was completed were able to completely opened the anterior visceral peritoneum and lower the bladder passed the coe ring.  We  then cauterized the right uterine artery and then the left uterine artery, section the left and then went back to section the right.  We then opened the vaginal vault starting at the posterior aspect using the tip of the Endo Shears scissor on the top of the coe ring.  We continued on either side and then left a little bridge anteriorly in order to proceed with morcellation of the uterus.  We used the Endo Shears  scissors to morcellate the uterus so that it would fit to pass vaginally.  This was accomplished without problems. We then switched instruments to the cutting needle driver and the long tip and the PK in the third arm.  The vaginal vault was closed with a 9 inch V. LOC in a running suture.  We started at the right angle finish on the left and came back on our steps.  The needle was parked in the left abdominal wall.  We irrigated and suctioned the abdominopelvic cavities. Hemostasis was adequate. We removed all the robotic instruments. The robot was undocked.  We then went by laparoscopy and removed in needle through the robotic port.  We irrigated and suctioned the abdominopelvic cavities. We used the Kleppinger to cauterize a small bleeder on the lower anterior wall where lysis of adhesions was performed.  Hemostasis was good.  Irrigation and suctioned was done.  All instruments were removed. The CO2 was evacuated. All trochars were removed under direct vision. The supraumbilical pursestring stitch was attached.  The Bovie was used to complete hemostasis on the incisions.  All incisions were closed with a subcuticular stitch of Vicryl 4-0. Dermabond was added on all incisions. The occluder was removed from the vagina. The patient was brought to recovery room in good and stable status.  ESTIMATED BLOOD LOSS: 100cc   Intake/Output Summary (Last 24 hours) at 10/17/12 1401 Last data filed at 10/17/12 1305  Gross per 24 hour  Intake   1000 ml  Output    450 ml  Net    550 ml     BLOOD ADMINISTERED:none   LOCAL MEDICATIONS USED:  MARCAINE     SPECIMEN:  Source of Specimen:  Uterus with tubes and cervix/uterine myomas and small right ovarian myoma  DISPOSITION OF SPECIMEN:  PATHOLOGY  COUNTS:  YES  PLAN OF CARE: Transfer to PACU   Genia Del MD  10/17/2012 at 2:04 pm

## 2012-10-18 ENCOUNTER — Encounter (HOSPITAL_COMMUNITY): Payer: Self-pay | Admitting: Obstetrics & Gynecology

## 2012-10-18 DIAGNOSIS — Z90710 Acquired absence of both cervix and uterus: Secondary | ICD-10-CM | POA: Diagnosis present

## 2012-10-18 NOTE — Anesthesia Postprocedure Evaluation (Signed)
  Anesthesia Post-op Note  Patient: Tracy Barry  Procedure(s) Performed: Procedure(s) (LRB) with comments: ROBOTIC ASSISTED TOTAL HYSTERECTOMY (N/A) - with bilateral salpingectomy; extensive lysis of adhesions  Patient Location: [facility]  Anesthesia Type:General  Level of Consciousness: awake, alert  and oriented  Airway and Oxygen Therapy: Patient Spontanous Breathing and Patient connected to nasal cannula oxygen SaO2 100%   Post-op Pain: mild  Post-op Assessment: Patient's Cardiovascular Status Stable, Respiratory Function Stable and No signs of Nausea or vomiting  Pt. States she has blood tinged sputum since surgery and sore throat.  States she has had sinus congestion prior to entering hospital.  Spoke with RN for follow up if needed.  No record of difficult intubation.  Post-op Vital Signs: stable  Complications: No apparent anesthesia complications

## 2012-10-18 NOTE — Addendum Note (Signed)
Addendum  created 10/18/12 2956 by Earmon Phoenix, CRNA   Modules edited:Notes Section

## 2012-10-18 NOTE — Progress Notes (Signed)
1 Day Post-Op Procedure(s) (LRB): ROBOTIC ASSISTED TOTAL HYSTERECTOMY (N/A) EXTENSIVE LYSIS OF ADHESIONS  Subjective: Patient reports that pain is well managed.  Tolerating normal diet as tolerated  diet without difficulty. No nausea / vomiting.  Ambulating and voiding.  Objective: BP 101/67  Pulse 83  Temp 98.4 F (36.9 C) (Oral)  Resp 18  Ht 5\' 4"  (1.626 m)  Wt 101.606 kg (224 lb)  BMI 38.45 kg/m2  SpO2 97% Lungs: clear Heart: normal rate and rhythm Abdomen:soft and appropriately tender Extremities: Homans sign is negative, no sign of DVT Incision: healing well  Assessment: s/p Procedure(s): ROBOTIC ASSISTED TOTAL HYSTERECTOMY: progressing well  Plan: Discharge home  LOS: 1 day    Saul Fabiano,MARIE-LYNE 10/18/2012, 9:01 AM

## 2013-06-10 ENCOUNTER — Other Ambulatory Visit (HOSPITAL_COMMUNITY): Payer: Self-pay | Admitting: Obstetrics & Gynecology

## 2013-06-10 DIAGNOSIS — Z1231 Encounter for screening mammogram for malignant neoplasm of breast: Secondary | ICD-10-CM

## 2013-06-18 ENCOUNTER — Ambulatory Visit (INDEPENDENT_AMBULATORY_CARE_PROVIDER_SITE_OTHER): Payer: Private Health Insurance - Indemnity | Admitting: Surgery

## 2013-06-18 ENCOUNTER — Other Ambulatory Visit (INDEPENDENT_AMBULATORY_CARE_PROVIDER_SITE_OTHER): Payer: Self-pay

## 2013-06-18 ENCOUNTER — Encounter (INDEPENDENT_AMBULATORY_CARE_PROVIDER_SITE_OTHER): Payer: Self-pay | Admitting: Surgery

## 2013-06-18 DIAGNOSIS — Z1231 Encounter for screening mammogram for malignant neoplasm of breast: Secondary | ICD-10-CM

## 2013-06-18 NOTE — Progress Notes (Signed)
Re:   Tracy Barry DOB:   1966-06-07 MRN:   [id number]  ASSESSMENT AND PLAN: 1.  Morbid obesity  Initial weight - 245, BMI - 41.9  Per the 1991 NIH Consensus Statement, the patient is a candidate for bariatric surgery. She was originally seen in [facility] by Dr. Lisbeth Renshaw.  She had a 4 month diet by [employer].   The patient attended our initial information session and reviewed the types of bariatric surgery.    The patient is interested in the Roux en Y Gastric Bypass.  I discussed with the patient the indications and risks of bariatric surgery.  The potential risks of surgery include, but are not limited to, bleeding, infection, leak from the bowel, DVT and PE, open surgery, long term nutrition consequences, and death.  The patient understands the importance of compliance and long term follow-up with our group after surgery.  From here we will obtain lab tests, x-rays, and nutrition consult.  She has had a psych consult.  She'll bring her brother to the next visit.  2.  Hypertension 3.  Diabetes mellitus x 4 years 4.  OSA - on CPAP 5.  GERD 6.  History of anxiety 7.  History of bipolar disease.  Chief Complaint  Patient presents with  . Bariatric Pre-op   REFERRING PHYSICIAN: Elby Showers, MD  HISTORY OF PRESENT ILLNESS: Tracy Barry is a 47 y.o. (DOB: [birth year])  AA  female whose primary care physician is [facility], MD and comes to me today for weight loss surgery.  She came by herself today.  The patient has already been evaluated at [facility] by Dr. Mardi Mainland.  [employer] required a 4 month diet - which she has completed.  So, she has already been through the process once.  She said that Dr. Clent Ridges talked primarily about the sleeve gastrectomy, but now she wants to consider the RYGB.  She has also been to our information session. She is not sure who talked at it.  The patient has tried multiple diets including Weight Watchers at least 4 times, [employer], Doylene Bode diet, Nutrisystem diet, [employer], and the 17 day diet. She has tried Sensa based diet.  She has not tried a prescription pill. She worked at [employer] and knew some people who had bariatric surgery who were successful with weight loss and some who were not.  Past Medical History  Diagnosis Date  . Hypertension   . Complication of anesthesia     decreased sa02 after anesthesia  . Anxiety   . Diabetes mellitus without complication   . Sleep apnea   . Headache(784.0)   . GERD (gastroesophageal reflux disease)     does not take medication to treat  . Congestion of upper airway       Past Surgical History  Procedure Laterality Date  . Myomectomy  2004  . Dilation and curettage of uterus    . Shoulder surgery    . Robotic assisted total hysterectomy  10/17/2012    Procedure: ROBOTIC ASSISTED TOTAL HYSTERECTOMY;  Surgeon: Genia Del, MD;  Location: [facility];  Service: Gynecology;  Laterality: N/A;  with bilateral salpingectomy; extensive lysis of adhesions     Current Outpatient Prescriptions  Medication Sig Dispense Refill  . Chlorphen-Phenyleph-ASA (ALKA-SELTZER PLUS COLD PO) Take 1 tablet by mouth daily as needed. For cold symptoms      . clonazePAM (KLONOPIN) 1 MG tablet Take 1 mg by mouth daily.      Marland Kitchen  estazolam (PROSOM) 2 MG tablet Take 2 mg by mouth at bedtime as needed. For sleep. Takes an hour after trazodone if not effective.      . hydrochlorothiazide (MICROZIDE) 12.5 MG capsule Take 12.5 mg by mouth daily.      Marland Kitchen lamoTRIgine (LAMICTAL) 150 MG tablet Take 150 mg by mouth 2 (two) times daily.      . metFORMIN (GLUCOPHAGE) 500 MG tablet       . oxyCODONE-acetaminophen (PERCOCET) 7.5-325 MG per tablet Take 1 tablet by mouth every 6 (six) hours as needed for pain.  30 tablet  0   No current facility-administered medications for this visit.    No Known Allergies  REVIEW OF SYSTEMS: Skin:  History of abdominoplasty in 2006. Infection:  No history of  hepatitis or HIV.  No history of MRSA. Neurologic:  No history of stroke.  No history of seizure.  No history of headaches. Cardiac:  Hypertension. No history of heart disease.  No history of prior cardiac catheterization.  No history of seeing a cardiologist. Pulmonary:  Asthma.  OSA x 6 years, but on CPAP x 2 months.  Endocrine:  Diabetes mellitus x 4 years.. No thyroid disease. Gastrointestinal:  She does have some GERD..  No history of liver disease.  No history of gall bladder disease.  No history of pancreas disease.  No history of colon disease. Urologic:  No history of kidney stones.  No history of bladder infections. GYN:  Hysterectomy (only) by Dr. Seymour Bars 10/17/2102. Musculoskeletal:  No history of joint or back disease. Hematologic:  No bleeding disorder.  No history of anemia.  Not anticoagulated. Psycho-social:  History of anxiety. History of bipolar disease - sees [facility] - Ocie Bob (prescribes)/Donna Agricultural engineer (counselor).  Saw Dr. Virgia Land 03/27/2013.  SOCIAL and FAMILY HISTORY: Unmarried. Lives with brother. On disability x 1 year from [facility] for bipolar/anxiety.  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  PHYSICAL EXAM: BP 139/86  Pulse 81  Temp(Src) 97.6 F (36.4 C) (Temporal)  Resp 16  Ht 5\' 4"  (1.626 m)  Wt 245 lb 6.4 oz (111.313 kg)  BMI 42.1 kg/m2  General: Obese AA female who is alert and generally healthy appearing.  HEENT: Normal. Pupils equal. Neck: Supple. No mass.  No thyroid mass. Lymph Nodes:  No supraclavicular or cervical nodes. Lungs: Clear to auscultation and symmetric breath sounds. Heart:  RRR. No murmur or rub.  Abdomen: Soft. No mass. No tenderness. No hernia. Normal bowel sounds.  Has scar from abdominoplasty about 8 years ago.  Has laparoscopic incision scars. She is a little more pear than apple. Rectal: Guaiac negative stool. Extremities:  Good strength and ROM  in upper and lower extremities. Neurologic:  Grossly intact to motor  and sensory function. Psychiatric: Has normal mood and affect. Behavior is normal.   DATA REVIEWED: Notes from [employer] and Epic notes.  Ovidio Kin, MD,  [facility] [address] [place]  [address]   [place], [place]    [address] Phone:  [phone] FAX:  [phone]

## 2013-06-24 LAB — CBC WITH DIFFERENTIAL/PLATELET
Basophils Absolute: 0 10*3/uL (ref 0.0–0.1)
Basophils Relative: 0 % (ref 0–1)
Eosinophils Absolute: 0.1 10*3/uL (ref 0.0–0.7)
Hemoglobin: 12.2 g/dL (ref 12.0–15.0)
MCH: 28.8 pg (ref 26.0–34.0)
MCHC: 33.5 g/dL (ref 30.0–36.0)
Monocytes Relative: 7 % (ref 3–12)
Neutrophils Relative %: 62 % (ref 43–77)
RDW: 15.7 % — ABNORMAL HIGH (ref 11.5–15.5)

## 2013-06-24 LAB — COMPREHENSIVE METABOLIC PANEL
ALT: 16 U/L (ref 0–35)
AST: 12 U/L (ref 0–37)
Albumin: 4.3 g/dL (ref 3.5–5.2)
Alkaline Phosphatase: 56 U/L (ref 39–117)
BUN: 13 mg/dL (ref 6–23)
Chloride: 103 mEq/L (ref 96–112)
Potassium: 4.2 mEq/L (ref 3.5–5.3)
Sodium: 137 mEq/L (ref 135–145)
Total Protein: 7.3 g/dL (ref 6.0–8.3)

## 2013-06-24 LAB — HEMOGLOBIN A1C
Hgb A1c MFr Bld: 6.4 % — ABNORMAL HIGH (ref <5.7)
Mean Plasma Glucose: 137 mg/dL — ABNORMAL HIGH (ref <117)

## 2013-06-25 LAB — TSH: TSH: 0.815 u[IU]/mL (ref 0.350–4.500)

## 2013-06-27 ENCOUNTER — Ambulatory Visit (HOSPITAL_COMMUNITY)
Admission: RE | Admit: 2013-06-27 | Discharge: 2013-06-27 | Disposition: A | Discharge status: Home / Self Care | Payer: Managed Care, Other (non HMO) | Source: Ambulatory Visit | Attending: Surgery | Admitting: Surgery

## 2013-06-27 ENCOUNTER — Encounter: Payer: Self-pay | Admitting: Dietician

## 2013-06-27 ENCOUNTER — Encounter (HOSPITAL_COMMUNITY)
Admission: RE | Disposition: A | Discharge status: Home / Self Care | Payer: Self-pay | Source: Ambulatory Visit | Attending: Surgery

## 2013-06-27 ENCOUNTER — Encounter: Payer: 59 | Attending: Surgery | Admitting: Dietician

## 2013-06-27 DIAGNOSIS — Z713 Dietary counseling and surveillance: Secondary | ICD-10-CM | POA: Insufficient documentation

## 2013-06-27 HISTORY — PX: BREATH TEK H PYLORI: SHX5422

## 2013-06-27 SURGERY — BREATH TEST, FOR HELICOBACTER PYLORI

## 2013-06-27 NOTE — Progress Notes (Signed)
  Pre-Op Assessment Visit:  Pre-Operative RYGB Surgery  Medical Nutrition Therapy:  Appt start time: 0945   End time:  1030.  Patient was seen on 06/27/2013 for Pre-Operative RYGB Nutrition Assessment. Assessment and letter of approval faxed to [facility] Bariatric Surgery Program coordinator on 06/27/2017.   Handouts given during visit include:  Pre-Op Goals Bariatric Surgery Protein Shakes  Patient to call the [facility] to enroll in Pre-Op and Post-Op Nutrition Education when surgery date is scheduled.

## 2013-06-27 NOTE — Patient Instructions (Addendum)
Contact Sherion at [facility] to see if you need more supervised weight loss. Contact [facility] when surgery is scheduled to enroll in Pre-Op class.

## 2013-06-30 ENCOUNTER — Encounter (HOSPITAL_COMMUNITY): Payer: Self-pay | Admitting: Surgery

## 2013-07-09 ENCOUNTER — Ambulatory Visit (HOSPITAL_COMMUNITY)
Admission: RE | Admit: 2013-07-09 | Discharge: 2013-07-09 | Disposition: A | Discharge status: Home / Self Care | Payer: Managed Care, Other (non HMO) | Source: Ambulatory Visit | Attending: Obstetrics & Gynecology | Admitting: Obstetrics & Gynecology

## 2013-07-09 DIAGNOSIS — Z1231 Encounter for screening mammogram for malignant neoplasm of breast: Secondary | ICD-10-CM | POA: Insufficient documentation

## 2013-07-22 ENCOUNTER — Other Ambulatory Visit: Payer: Self-pay

## 2013-07-22 ENCOUNTER — Ambulatory Visit (HOSPITAL_COMMUNITY)
Admission: RE | Admit: 2013-07-22 | Discharge: 2013-07-22 | Disposition: A | Discharge status: Home / Self Care | Payer: Managed Care, Other (non HMO) | Source: Ambulatory Visit | Attending: Surgery | Admitting: Surgery

## 2013-07-22 DIAGNOSIS — I10 Essential (primary) hypertension: Secondary | ICD-10-CM | POA: Insufficient documentation

## 2013-07-22 DIAGNOSIS — G4733 Obstructive sleep apnea (adult) (pediatric): Secondary | ICD-10-CM | POA: Insufficient documentation

## 2013-07-22 DIAGNOSIS — E119 Type 2 diabetes mellitus without complications: Secondary | ICD-10-CM | POA: Insufficient documentation

## 2013-07-22 DIAGNOSIS — K7689 Other specified diseases of liver: Secondary | ICD-10-CM | POA: Insufficient documentation

## 2013-07-22 DIAGNOSIS — K219 Gastro-esophageal reflux disease without esophagitis: Secondary | ICD-10-CM | POA: Insufficient documentation

## 2013-07-22 DIAGNOSIS — K769 Liver disease, unspecified: Secondary | ICD-10-CM | POA: Insufficient documentation

## 2013-07-22 DIAGNOSIS — Z6841 Body Mass Index (BMI) 40.0 and over, adult: Secondary | ICD-10-CM | POA: Insufficient documentation

## 2013-07-29 ENCOUNTER — Ambulatory Visit (HOSPITAL_COMMUNITY): Payer: 59

## 2013-08-22 ENCOUNTER — Other Ambulatory Visit (INDEPENDENT_AMBULATORY_CARE_PROVIDER_SITE_OTHER): Payer: Self-pay | Admitting: Surgery

## 2013-08-27 ENCOUNTER — Telehealth: Payer: Self-pay | Admitting: Dietician

## 2013-08-27 NOTE — Telephone Encounter (Signed)
Emailed Pre-Op Diet to prepare for upcoming RYGB surgery.

## 2013-08-28 ENCOUNTER — Encounter: Payer: Managed Care, Other (non HMO) | Attending: Surgery

## 2013-08-28 ENCOUNTER — Encounter (INDEPENDENT_AMBULATORY_CARE_PROVIDER_SITE_OTHER): Payer: Self-pay | Admitting: Surgery

## 2013-08-28 ENCOUNTER — Ambulatory Visit (INDEPENDENT_AMBULATORY_CARE_PROVIDER_SITE_OTHER): Payer: Private Health Insurance - Indemnity | Admitting: Surgery

## 2013-08-28 ENCOUNTER — Encounter (HOSPITAL_COMMUNITY): Payer: Self-pay | Admitting: Pharmacy Technician

## 2013-08-28 DIAGNOSIS — Z713 Dietary counseling and surveillance: Secondary | ICD-10-CM | POA: Insufficient documentation

## 2013-08-28 NOTE — Patient Instructions (Signed)

## 2013-08-28 NOTE — Progress Notes (Signed)
Re:   Tracy Barry DOB:   [birth year] MRN:   [id number]  ASSESSMENT AND PLAN: 1.  Morbid obesity  Initial weight - 245, BMI - 41.9  Per the 1991 NIH Consensus Statement, the patient is a candidate for bariatric surgery. She was originally seen in [facility] by Dr. Lisbeth Renshaw.  She had a 4 month diet by [employer].   The patient attended our initial information session and reviewed the types of bariatric surgery.    The patient is interested in the Roux en Y Gastric Bypass.  I discussed with the patient the indications and risks of bariatric surgery.  The potential risks of surgery include, but are not limited to, bleeding, infection, leak from the bowel, DVT and PE, open surgery, long term nutrition consequences, and death.  The patient understands the importance of compliance and long term follow-up with our group after surgery.  From here we will obtain lab tests, x-rays, and nutrition consult.  She has had a psych consult.  She'll bring her brother to the next visit.  2.  Hypertension 3.  Diabetes mellitus x 4 years 4.  OSA - on CPAP 5.  GERD 6.  History of anxiety 7.  History of bipolar disease.  Morbid obesity with co morbidities   REFERRING PHYSICIAN: Elby Showers, MD  HISTORY OF PRESENT ILLNESS: Tracy Barry is a 47 y.o. (DOB: 07-26-1966)  AA  female whose primary care physician is [facility], MD and comes to me today for weight loss surgery.  She came by herself today.  The patient has already been evaluated at [facility] by Dr. Mardi Mainland.  [employer] required a 4 month diet - which she has completed.  So, she has already been through the process once.  She said that Dr. Clent Ridges talked primarily about the sleeve gastrectomy, but now she wants to consider the RYGB.  She has also been to our information session. She is not sure who talked at it.  The patient has tried multiple diets including Weight Watchers at least 4 times, [employer], Doylene Bode diet, Nutrisystem  diet, [employer], and the 17 day diet. She has tried Sensa based diet.  She has not tried a prescription pill. She worked at [employer] and knew some people who had bariatric surgery who were successful with weight loss and some who were not.  Past Medical History  Diagnosis Date  . Hypertension   . Complication of anesthesia     decreased sa02 after anesthesia  . Anxiety   . Diabetes mellitus without complication   . Sleep apnea   . Headache(784.0)   . GERD (gastroesophageal reflux disease)     does not take medication to treat  . Congestion of upper airway       Past Surgical History  Procedure Laterality Date  . Myomectomy  2004  . Dilation and curettage of uterus    . Shoulder surgery    . Robotic assisted total hysterectomy  10/17/2012    Procedure: ROBOTIC ASSISTED TOTAL HYSTERECTOMY;  Surgeon: Genia Del, MD;  Location: [facility];  Service: Gynecology;  Laterality: N/A;  with bilateral salpingectomy; extensive lysis of adhesions  . Breath tek h pylori N/A 06/27/2013    Procedure: BREATH TEK H PYLORI;  Surgeon: Kandis Cocking, MD;  Location: [facility];  Service: General;  Laterality: N/A;     Current Outpatient Prescriptions  Medication Sig Dispense Refill  . Chlorphen-Phenyleph-ASA (ALKA-SELTZER PLUS COLD PO) Take 1 tablet  by mouth daily as needed (cold). For cold symptoms      . clonazePAM (KLONOPIN) 1 MG tablet Take 1 mg by mouth daily.      Marland Kitchen doxepin (SINEQUAN) 150 MG capsule Take 150 mg by mouth at bedtime.      . hydrochlorothiazide (MICROZIDE) 12.5 MG capsule Take 12.5 mg by mouth daily with breakfast.       . lamoTRIgine (LAMICTAL) 150 MG tablet Take 150 mg by mouth 2 (two) times daily.      . metFORMIN (GLUCOPHAGE) 500 MG tablet Take 500 mg by mouth 2 (two) times daily.       . minoxidil (ROGAINE) 2 % external solution Apply 1 application topically 2 (two) times daily.      . Pseudoeph-Doxylamine-DM-APAP (NYQUIL PO) Take 30 mLs by mouth daily as  needed (cold).       No current facility-administered medications for this visit.    No Known Allergies  REVIEW OF SYSTEMS: Skin:  History of abdominoplasty in 2006. Infection:  No history of hepatitis or HIV.  No history of MRSA. Neurologic:  No history of stroke.  No history of seizure.  No history of headaches. Cardiac:  Hypertension. No history of heart disease.  No history of prior cardiac catheterization.  No history of seeing a cardiologist. Pulmonary:  Asthma.  OSA x 6 years, but on CPAP x 2 months.  Endocrine:  Diabetes mellitus x 4 years.. No thyroid disease. Gastrointestinal:  She does have some GERD..  No history of liver disease.  No history of gall bladder disease.  No history of pancreas disease.  No history of colon disease. Urologic:  No history of kidney stones.  No history of bladder infections. GYN:  Hysterectomy (only) by Dr. Seymour Bars 10/17/2102. Musculoskeletal:  No history of joint or back disease. Hematologic:  No bleeding disorder.  No history of anemia.  Not anticoagulated. Psycho-social:  History of anxiety. History of bipolar disease - sees [facility] - Ocie Bob (prescribes)/Donna Agricultural engineer (counselor).  Saw Dr. Virgia Land 03/27/2013.  SOCIAL and FAMILY HISTORY: Unmarried. Lives with brother. On disability x 1 year from [facility] for bipolar/anxiety.  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  PHYSICAL EXAM: LMP 10/01/2012  General: Obese AA female who is alert and generally healthy appearing.  HEENT: Normal. Pupils equal. Neck: Supple. No mass.  No thyroid mass. Lymph Nodes:  No supraclavicular or cervical nodes. Lungs: Clear to auscultation and symmetric breath sounds. Heart:  RRR. No murmur or rub.  Abdomen: Soft. No mass. No tenderness. No hernia. Normal bowel sounds.  Has scar from abdominoplasty about 8 years ago.  Has laparoscopic incision scars. She is a little more pear than apple. Rectal: Guaiac negative stool. Extremities:  Good strength and  ROM  in upper and lower extremities. Neurologic:  Grossly intact to motor and sensory function. Psychiatric: Has normal mood and affect. Behavior is normal.   DATA REVIEWED: Notes from [employer] and Epic notes.  The patient came in today and Dr. Ezzard Standing was called away. I discussed Roux-en-Y gastric bypass and answered questions that she had about sleeve gastrectomy. She reports no changes in her comorbidities were physical exam. Given her bowel prep instructions and GoLYTELY and erythromycin et Karie Soda. She is set for 1:00 on December 2. Having no further questions she was sent home and will be read for a Roux-en-Y gastric bypass on December 2.  Matt B. Daphine Deutscher, MD, [facility] [phone] beeper [phone]  08/28/2013 2:16 PM

## 2013-09-01 ENCOUNTER — Encounter (HOSPITAL_COMMUNITY)
Admission: RE | Admit: 2013-09-01 | Discharge: 2013-09-01 | Disposition: A | Discharge status: Home / Self Care | Payer: Managed Care, Other (non HMO) | Source: Ambulatory Visit | Attending: Surgery | Admitting: Surgery

## 2013-09-01 ENCOUNTER — Encounter (HOSPITAL_COMMUNITY): Payer: Self-pay

## 2013-09-01 DIAGNOSIS — Z01818 Encounter for other preprocedural examination: Secondary | ICD-10-CM | POA: Insufficient documentation

## 2013-09-01 DIAGNOSIS — Z01812 Encounter for preprocedural laboratory examination: Secondary | ICD-10-CM | POA: Insufficient documentation

## 2013-09-01 HISTORY — DX: Mental disorder, not otherwise specified: F99

## 2013-09-01 LAB — COMPREHENSIVE METABOLIC PANEL
ALT: 27 U/L (ref 0–35)
AST: 19 U/L (ref 0–37)
Albumin: 4.4 g/dL (ref 3.5–5.2)
Alkaline Phosphatase: 69 U/L (ref 39–117)
CO2: 27 mEq/L (ref 19–32)
Chloride: 96 mEq/L (ref 96–112)
Creatinine, Ser: 0.93 mg/dL (ref 0.50–1.10)
GFR calc non Af Amer: 71 mL/min — ABNORMAL LOW (ref >90)
Potassium: 4.2 mEq/L (ref 3.5–5.1)
Sodium: 136 mEq/L (ref 135–145)
Total Bilirubin: 0.3 mg/dL (ref 0.3–1.2)

## 2013-09-01 LAB — CBC WITH DIFFERENTIAL/PLATELET
Basophils Absolute: 0 10*3/uL (ref 0.0–0.1)
Basophils Relative: 0 % (ref 0–1)
HCT: 38.3 % (ref 36.0–46.0)
Lymphocytes Relative: 23 % (ref 12–46)
MCHC: 33.4 g/dL (ref 30.0–36.0)
MCV: 85.5 fL (ref 78.0–100.0)
Monocytes Absolute: 0.7 10*3/uL (ref 0.1–1.0)
Neutro Abs: 6.2 10*3/uL (ref 1.7–7.7)
Neutrophils Relative %: 68 % (ref 43–77)
Platelets: 321 10*3/uL (ref 150–400)
RDW: 14.8 % (ref 11.5–15.5)
WBC: 9.1 10*3/uL (ref 4.0–10.5)

## 2013-09-01 NOTE — Progress Notes (Signed)
  Pre-Operative Nutrition Class:  Appt start time: 830   End time:  1030.  Patient was seen on 08/28/2013 for Pre-Operative Bariatric Surgery Education at [facility].   Surgery date: 09/09/2013 Surgery type: Gastric Bypass   The following the learning objectives were met by the patient during this course:  Identify Pre-Op Dietary Goals and will begin 2 weeks pre-operatively  Identify appropriate sources of fluids and proteins   State protein recommendations and appropriate sources pre and post-operatively  Identify Post-Operative Dietary Goals and will follow for 2 weeks post-operatively  Identify appropriate multivitamin and calcium sources  Describe the need for physical activity post-operatively and will follow MD recommendations  State when to call healthcare provider regarding medication questions or post-operative complications  Handouts given during class include:  Pre-Op Bariatric Surgery Diet Handout  Protein Shake Handout  Post-Op Bariatric Surgery Nutrition Handout  BELT Program Information Flyer  Support Group Information Flyer  [facility] Bariatric Supplements Price List  Follow-Up Plan: Patient will follow-up at [facility] 2 weeks post operatively for diet advancement per MD.

## 2013-09-01 NOTE — Progress Notes (Signed)
09-01-13 1250 Labs viewable in Epic-please note.

## 2013-09-01 NOTE — Patient Instructions (Addendum)
18 Tracy Barry  09/01/2013   Your procedure is scheduled on: 12-2  -2014  Report to [facility] at     1100   AM .  Call this number if you have problems the morning of surgery: [phone]  Or Presurgical Testing [phone](Maisen Schmit)   Remember: Follow any bowel prep instructions per MD office. For Cpap use: Bring mask and tubing only.   Do not eat food:After Midnight.  May have clear liquids:up to 6 Hours before arrival. Nothing after : 0700 AM  Clear liquids include soda, tea, black coffee, apple or grape juice, broth.  Take these medicines the morning of surgery with A SIP OF WATER: Clonazepam. Lamictal. Do not take any Diabetic meds AM of.   Do not wear jewelry, make-up or nail polish.  Do not wear lotions, powders, or perfumes. You may wear deodorant.  Do not shave 12 hours prior to first CHG shower(legs and under arms).(face and neck okay.)  Do not bring valuables to the hospital.  Contacts, dentures or removable bridgework, body piercing, hair pins may not be worn into surgery.  Leave suitcase in the car. After surgery it may be brought to your room.  For patients admitted to the hospital, checkout time is 11:00 AM the day of discharge.   Patients discharged the day of surgery will not be allowed to drive home. Must have responsible person with you x 24 hours once discharged.  Name and phone number of your driver: [name], mother [phone] cell  Special Instructions: CHG(Chlorhedine 4%-"Hibiclens","Betasept","Aplicare") Shower Use Special Wash: see special instructions.(avoid face and genitals)   Please read over the following fact sheets that you were given:  Incentive Spirometry Instruction.    Failure to follow these instructions may result in Cancellation of your surgery.   Patient signature_______________________________________________________

## 2013-09-01 NOTE — Pre-Procedure Instructions (Addendum)
09-01-13 EKG 10'14-Epic. 09-01-13 1250 Labs viewable in Epic, note to Dr. Allene Pyo office. W. Kennon Portela

## 2013-09-03 ENCOUNTER — Telehealth (HOSPITAL_COMMUNITY): Payer: Self-pay

## 2013-09-03 NOTE — Telephone Encounter (Signed)
Received phone call from patient re: experiencing cold symptoms and wanted to know if it was ok to take acetaminophen.  I assured her that acetaminophen was ok to take preoperatively, but recommended that she contact the surgeons office to let them know she was experiencing cold symptoms and to determine what acceptable over the counter medications could be taken prior to surgery.

## 2013-09-09 ENCOUNTER — Telehealth (INDEPENDENT_AMBULATORY_CARE_PROVIDER_SITE_OTHER): Payer: Self-pay | Admitting: *Deleted

## 2013-09-09 ENCOUNTER — Encounter (HOSPITAL_COMMUNITY)
Admission: RE | Disposition: A | Discharge status: Home / Self Care | Payer: Self-pay | Source: Ambulatory Visit | Attending: Surgery

## 2013-09-09 ENCOUNTER — Inpatient Hospital Stay (HOSPITAL_COMMUNITY): Payer: Managed Care, Other (non HMO)

## 2013-09-09 ENCOUNTER — Encounter (HOSPITAL_COMMUNITY): Payer: Self-pay | Admitting: *Deleted

## 2013-09-09 ENCOUNTER — Inpatient Hospital Stay (HOSPITAL_COMMUNITY): Payer: Managed Care, Other (non HMO) | Admitting: Anesthesiology

## 2013-09-09 ENCOUNTER — Encounter (HOSPITAL_COMMUNITY): Payer: Managed Care, Other (non HMO) | Admitting: Anesthesiology

## 2013-09-09 ENCOUNTER — Inpatient Hospital Stay (HOSPITAL_COMMUNITY)
Admission: RE | Admit: 2013-09-09 | Discharge: 2013-09-11 | DRG: 621 | Disposition: A | Discharge status: Home / Self Care | Payer: Managed Care, Other (non HMO) | Source: Ambulatory Visit | Attending: Surgery | Admitting: Surgery

## 2013-09-09 DIAGNOSIS — F319 Bipolar disorder, unspecified: Secondary | ICD-10-CM | POA: Diagnosis present

## 2013-09-09 DIAGNOSIS — I10 Essential (primary) hypertension: Secondary | ICD-10-CM

## 2013-09-09 DIAGNOSIS — Z23 Encounter for immunization: Secondary | ICD-10-CM

## 2013-09-09 DIAGNOSIS — Z6841 Body Mass Index (BMI) 40.0 and over, adult: Secondary | ICD-10-CM | POA: Diagnosis not present

## 2013-09-09 DIAGNOSIS — F411 Generalized anxiety disorder: Secondary | ICD-10-CM | POA: Diagnosis present

## 2013-09-09 DIAGNOSIS — K219 Gastro-esophageal reflux disease without esophagitis: Secondary | ICD-10-CM | POA: Diagnosis present

## 2013-09-09 DIAGNOSIS — G4733 Obstructive sleep apnea (adult) (pediatric): Secondary | ICD-10-CM | POA: Diagnosis present

## 2013-09-09 DIAGNOSIS — Z79899 Other long term (current) drug therapy: Secondary | ICD-10-CM

## 2013-09-09 DIAGNOSIS — E119 Type 2 diabetes mellitus without complications: Secondary | ICD-10-CM | POA: Diagnosis present

## 2013-09-09 DIAGNOSIS — K660 Peritoneal adhesions (postprocedural) (postinfection): Secondary | ICD-10-CM | POA: Diagnosis present

## 2013-09-09 HISTORY — PX: UPPER GI ENDOSCOPY: SHX6162

## 2013-09-09 HISTORY — PX: GASTRIC ROUX-EN-Y: SHX5262

## 2013-09-09 LAB — GLUCOSE, CAPILLARY
Glucose-Capillary: 110 mg/dL — ABNORMAL HIGH (ref 70–99)
Glucose-Capillary: 135 mg/dL — ABNORMAL HIGH (ref 70–99)
Glucose-Capillary: 144 mg/dL — ABNORMAL HIGH (ref 70–99)
Glucose-Capillary: 149 mg/dL — ABNORMAL HIGH (ref 70–99)

## 2013-09-09 LAB — HEMOGLOBIN AND HEMATOCRIT, BLOOD: HCT: 36.3 % (ref 36.0–46.0)

## 2013-09-09 SURGERY — LAPAROSCOPIC ROUX-EN-Y GASTRIC BYPASS WITH UPPER ENDOSCOPY
Anesthesia: General | Site: Esophagus

## 2013-09-09 MED ORDER — FENTANYL CITRATE 0.05 MG/ML IJ SOLN
INTRAMUSCULAR | Status: DC | PRN
  Administered 2013-09-09 (×3): 50 ug via INTRAVENOUS
  Administered 2013-09-09: 100 ug via INTRAVENOUS

## 2013-09-09 MED ORDER — OXYCODONE HCL 5 MG/5ML PO SOLN
5.0000 mg | Freq: Once | ORAL | Status: DC | PRN
  Filled 2013-09-09: qty 5

## 2013-09-09 MED ORDER — HYDROMORPHONE HCL PF 1 MG/ML IJ SOLN
INTRAMUSCULAR | Status: DC | PRN
  Administered 2013-09-09 (×2): 1 mg via INTRAVENOUS

## 2013-09-09 MED ORDER — ONDANSETRON HCL 4 MG/2ML IJ SOLN
4.0000 mg | INTRAMUSCULAR | Status: DC | PRN
  Administered 2013-09-09: 4 mg via INTRAVENOUS
  Filled 2013-09-09: qty 2

## 2013-09-09 MED ORDER — SUCCINYLCHOLINE CHLORIDE 20 MG/ML IJ SOLN
INTRAMUSCULAR | Status: DC | PRN
  Administered 2013-09-09: 160 mg via INTRAVENOUS

## 2013-09-09 MED ORDER — HYDROMORPHONE HCL PF 2 MG/ML IJ SOLN
INTRAMUSCULAR | Status: AC
  Filled 2013-09-09: qty 1

## 2013-09-09 MED ORDER — HYDROMORPHONE HCL PF 1 MG/ML IJ SOLN
0.2500 mg | INTRAMUSCULAR | Status: DC | PRN
  Administered 2013-09-09: 0.5 mg via INTRAVENOUS

## 2013-09-09 MED ORDER — POTASSIUM CHLORIDE IN NACL 20-0.45 MEQ/L-% IV SOLN
INTRAVENOUS | Status: DC
  Administered 2013-09-09 – 2013-09-11 (×6): via INTRAVENOUS
  Filled 2013-09-09 (×9): qty 1000

## 2013-09-09 MED ORDER — ROCURONIUM BROMIDE 100 MG/10ML IV SOLN
INTRAVENOUS | Status: AC
  Filled 2013-09-09: qty 1

## 2013-09-09 MED ORDER — UNJURY CHICKEN SOUP POWDER: 2.0000 [oz_av] | Freq: Four times a day (QID) | ORAL | Status: DC

## 2013-09-09 MED ORDER — INSULIN ASPART 100 UNIT/ML ~~LOC~~ SOLN
0.0000 [IU] | SUBCUTANEOUS | Status: DC
  Administered 2013-09-09 – 2013-09-11 (×7): 3 [IU] via SUBCUTANEOUS

## 2013-09-09 MED ORDER — HYDROMORPHONE HCL PF 1 MG/ML IJ SOLN
INTRAMUSCULAR | Status: AC
  Filled 2013-09-09: qty 1

## 2013-09-09 MED ORDER — BUPIVACAINE HCL (PF) 0.25 % IJ SOLN
INTRAMUSCULAR | Status: AC
  Filled 2013-09-09: qty 30

## 2013-09-09 MED ORDER — NEOSTIGMINE METHYLSULFATE 1 MG/ML IJ SOLN
INTRAMUSCULAR | Status: AC
  Filled 2013-09-09: qty 10

## 2013-09-09 MED ORDER — UNJURY VANILLA POWDER
2.0000 [oz_av] | Freq: Four times a day (QID) | ORAL | Status: DC
  Administered 2013-09-11 (×2): 2 [oz_av] via ORAL

## 2013-09-09 MED ORDER — UNJURY CHOCOLATE CLASSIC POWDER: 2.0000 [oz_av] | Freq: Four times a day (QID) | ORAL | Status: DC

## 2013-09-09 MED ORDER — HEPARIN SODIUM (PORCINE) 5000 UNIT/ML IJ SOLN
5000.0000 [IU] | Freq: Three times a day (TID) | INTRAMUSCULAR | Status: DC
  Administered 2013-09-10 – 2013-09-11 (×6): 5000 [IU] via SUBCUTANEOUS
  Filled 2013-09-09 (×8): qty 1

## 2013-09-09 MED ORDER — SUCCINYLCHOLINE CHLORIDE 20 MG/ML IJ SOLN
INTRAMUSCULAR | Status: AC
  Filled 2013-09-09: qty 2

## 2013-09-09 MED ORDER — EVICEL 5 ML EX KIT
PACK | CUTANEOUS | Status: DC | PRN
  Administered 2013-09-09: 2

## 2013-09-09 MED ORDER — MIDAZOLAM HCL 5 MG/5ML IJ SOLN
INTRAMUSCULAR | Status: DC | PRN
  Administered 2013-09-09: 2 mg via INTRAVENOUS

## 2013-09-09 MED ORDER — ONDANSETRON HCL 4 MG/2ML IJ SOLN
INTRAMUSCULAR | Status: AC
  Filled 2013-09-09: qty 2

## 2013-09-09 MED ORDER — OXYCODONE HCL 5 MG PO TABS
5.0000 mg | ORAL_TABLET | Freq: Once | ORAL | Status: DC | PRN
Start: 2013-09-09 — End: 2013-09-09

## 2013-09-09 MED ORDER — DEXAMETHASONE SODIUM PHOSPHATE 10 MG/ML IJ SOLN
INTRAMUSCULAR | Status: AC
  Filled 2013-09-09: qty 1

## 2013-09-09 MED ORDER — LIDOCAINE HCL (CARDIAC) 20 MG/ML IV SOLN
INTRAVENOUS | Status: DC | PRN
  Administered 2013-09-09: 100 mg via INTRAVENOUS

## 2013-09-09 MED ORDER — GLYCOPYRROLATE 0.2 MG/ML IJ SOLN
INTRAMUSCULAR | Status: DC | PRN
  Administered 2013-09-09: 0.6 mg via INTRAVENOUS

## 2013-09-09 MED ORDER — NEOSTIGMINE METHYLSULFATE 1 MG/ML IJ SOLN
INTRAMUSCULAR | Status: DC | PRN
  Administered 2013-09-09: 5 mg via INTRAVENOUS

## 2013-09-09 MED ORDER — LIDOCAINE HCL (CARDIAC) 20 MG/ML IV SOLN
INTRAVENOUS | Status: AC
  Filled 2013-09-09: qty 5

## 2013-09-09 MED ORDER — MORPHINE SULFATE 2 MG/ML IJ SOLN
2.0000 mg | INTRAMUSCULAR | Status: DC | PRN
  Administered 2013-09-09: 2 mg via INTRAVENOUS
  Administered 2013-09-09 – 2013-09-10 (×3): 4 mg via INTRAVENOUS
  Administered 2013-09-10: 2 mg via INTRAVENOUS
  Administered 2013-09-10: 4 mg via INTRAVENOUS
  Administered 2013-09-10: 2 mg via INTRAVENOUS
  Administered 2013-09-10: 4 mg via INTRAVENOUS
  Administered 2013-09-10 – 2013-09-11 (×2): 2 mg via INTRAVENOUS
  Administered 2013-09-11: 4 mg via INTRAVENOUS
  Filled 2013-09-09 (×2): qty 1
  Filled 2013-09-09 (×2): qty 2
  Filled 2013-09-09: qty 1
  Filled 2013-09-09 (×2): qty 2
  Filled 2013-09-09: qty 1
  Filled 2013-09-09 (×4): qty 2

## 2013-09-09 MED ORDER — DEXTROSE 5 % IV SOLN
2.0000 g | INTRAVENOUS | Status: AC
  Administered 2013-09-09: 2 g via INTRAVENOUS
  Filled 2013-09-09: qty 2

## 2013-09-09 MED ORDER — GLYCOPYRROLATE 0.2 MG/ML IJ SOLN
INTRAMUSCULAR | Status: AC
  Filled 2013-09-09: qty 3

## 2013-09-09 MED ORDER — ROCURONIUM BROMIDE 100 MG/10ML IV SOLN
INTRAVENOUS | Status: DC | PRN
  Administered 2013-09-09: 20 mg via INTRAVENOUS
  Administered 2013-09-09 (×2): 10 mg via INTRAVENOUS
  Administered 2013-09-09: 40 mg via INTRAVENOUS

## 2013-09-09 MED ORDER — LACTATED RINGERS IV SOLN
INTRAVENOUS | Status: DC
Start: 2013-09-09 — End: 2013-09-09
  Administered 2013-09-09: 15:00:00 via INTRAVENOUS
  Administered 2013-09-09: 1000 mL via INTRAVENOUS

## 2013-09-09 MED ORDER — MEPERIDINE HCL 50 MG/ML IJ SOLN: 6.2500 mg | INTRAMUSCULAR | Status: DC | PRN

## 2013-09-09 MED ORDER — INFLUENZA VAC SPLIT QUAD 0.5 ML IM SUSP
0.5000 mL | INTRAMUSCULAR | Status: AC
  Administered 2013-09-10: 0.5 mL via INTRAMUSCULAR
  Filled 2013-09-09 (×2): qty 0.5

## 2013-09-09 MED ORDER — ONDANSETRON HCL 4 MG/2ML IJ SOLN
INTRAMUSCULAR | Status: DC | PRN
  Administered 2013-09-09: 4 mg via INTRAVENOUS

## 2013-09-09 MED ORDER — PROPOFOL 10 MG/ML IV BOLUS
INTRAVENOUS | Status: AC
  Filled 2013-09-09: qty 20

## 2013-09-09 MED ORDER — MIDAZOLAM HCL 2 MG/2ML IJ SOLN
INTRAMUSCULAR | Status: AC
  Filled 2013-09-09: qty 2

## 2013-09-09 MED ORDER — PNEUMOCOCCAL VAC POLYVALENT 25 MCG/0.5ML IJ INJ
0.5000 mL | INJECTION | INTRAMUSCULAR | Status: AC
  Administered 2013-09-10: 0.5 mL via INTRAMUSCULAR
  Filled 2013-09-09 (×2): qty 0.5

## 2013-09-09 MED ORDER — PROMETHAZINE HCL 25 MG/ML IJ SOLN
6.2500 mg | INTRAMUSCULAR | Status: DC | PRN
  Administered 2013-09-09: 6.25 mg via INTRAVENOUS

## 2013-09-09 MED ORDER — ACETAMINOPHEN 160 MG/5ML PO SOLN: 650.0000 mg | ORAL | Status: DC | PRN

## 2013-09-09 MED ORDER — PROMETHAZINE HCL 25 MG/ML IJ SOLN
INTRAMUSCULAR | Status: AC
  Filled 2013-09-09: qty 1

## 2013-09-09 MED ORDER — DEXTROSE 5 % IV SOLN
INTRAVENOUS | Status: AC
  Filled 2013-09-09 (×2): qty 1

## 2013-09-09 MED ORDER — PROPOFOL 10 MG/ML IV BOLUS
INTRAVENOUS | Status: DC | PRN
  Administered 2013-09-09: 200 mg via INTRAVENOUS

## 2013-09-09 MED ORDER — BUPIVACAINE HCL (PF) 0.25 % IJ SOLN
INTRAMUSCULAR | Status: DC | PRN
  Administered 2013-09-09: 30 mL

## 2013-09-09 MED ORDER — HEPARIN SODIUM (PORCINE) 5000 UNIT/ML IJ SOLN
5000.0000 [IU] | INTRAMUSCULAR | Status: AC
  Administered 2013-09-09: 5000 [IU] via SUBCUTANEOUS
  Filled 2013-09-09: qty 1

## 2013-09-09 MED ORDER — TISSEEL VH 10 ML EX KIT
PACK | CUTANEOUS | Status: AC
  Filled 2013-09-09: qty 2

## 2013-09-09 MED ORDER — LACTATED RINGERS IR SOLN
Status: DC | PRN
  Administered 2013-09-09: 3000 mL

## 2013-09-09 MED ORDER — DEXAMETHASONE SODIUM PHOSPHATE 10 MG/ML IJ SOLN
INTRAMUSCULAR | Status: DC | PRN
  Administered 2013-09-09: 4 mg via INTRAVENOUS

## 2013-09-09 MED ORDER — FENTANYL CITRATE 0.05 MG/ML IJ SOLN
INTRAMUSCULAR | Status: AC
  Filled 2013-09-09: qty 5

## 2013-09-09 MED ORDER — ALBUTEROL SULFATE HFA 108 (90 BASE) MCG/ACT IN AERS
INHALATION_SPRAY | RESPIRATORY_TRACT | Status: DC | PRN
  Administered 2013-09-09: 3 via RESPIRATORY_TRACT
  Administered 2013-09-09: 5 via RESPIRATORY_TRACT

## 2013-09-09 MED ORDER — ALBUTEROL SULFATE HFA 108 (90 BASE) MCG/ACT IN AERS
INHALATION_SPRAY | RESPIRATORY_TRACT | Status: AC
  Filled 2013-09-09: qty 6.7

## 2013-09-09 SURGICAL SUPPLY — 72 items
ADH SKN CLS APL DERMABOND .7 (GAUZE/BANDAGES/DRESSINGS) ×4
APL SRG 32X5 SNPLK LF DISP (MISCELLANEOUS) ×2
APPLICATOR COTTON TIP 6IN STRL (MISCELLANEOUS) ×4 IMPLANT
BAG SPEC RTRVL LRG 6X4 10 (ENDOMECHANICALS)
BLADE SURG 15 STRL LF DISP TIS (BLADE) ×2 IMPLANT
BLADE SURG 15 STRL SS (BLADE) ×3
CABLE HIGH FREQUENCY MONO STRZ (ELECTRODE) ×1 IMPLANT
CANISTER SUCTION 2500CC (MISCELLANEOUS) ×4 IMPLANT
CHLORAPREP W/TINT 26ML (MISCELLANEOUS) ×6 IMPLANT
CLIP SUT LAPRA TY ABSORB (SUTURE) ×3 IMPLANT
CUTTER LINEAR ENDO ART 45 ETS (STAPLE) ×3 IMPLANT
DECANTER SPIKE VIAL GLASS SM (MISCELLANEOUS) ×3 IMPLANT
DERMABOND ADVANCED (GAUZE/BANDAGES/DRESSINGS) ×2
DERMABOND ADVANCED .7 DNX12 (GAUZE/BANDAGES/DRESSINGS) IMPLANT
DEVICE SUTURE ENDOST 10MM (ENDOMECHANICALS) ×3 IMPLANT
DISSECTOR BLUNT TIP ENDO 5MM (MISCELLANEOUS) IMPLANT
DRAIN PENROSE 18X1/4 LTX STRL (WOUND CARE) ×3 IMPLANT
DRAPE CAMERA CLOSED 9X96 (DRAPES) ×3 IMPLANT
DRAPE UTILITY XL STRL (DRAPES) ×3 IMPLANT
DUPLOJECT EASY PREP 4ML (MISCELLANEOUS) ×2 IMPLANT
GAUZE SPONGE 4X4 16PLY XRAY LF (GAUZE/BANDAGES/DRESSINGS) ×3 IMPLANT
GLOVE BIOGEL PI IND STRL 7.0 (GLOVE) ×2 IMPLANT
GLOVE BIOGEL PI INDICATOR 7.0 (GLOVE) ×1
GLOVE SURG SIGNA 7.5 PF LTX (GLOVE) ×3 IMPLANT
GOWN PREVENTION PLUS LG XLONG (DISPOSABLE) ×2 IMPLANT
GOWN STRL REIN XL XLG (GOWN DISPOSABLE) ×8 IMPLANT
HOVERMATT SINGLE USE (MISCELLANEOUS) ×3 IMPLANT
KIT BASIN OR (CUSTOM PROCEDURE TRAY) ×3 IMPLANT
KIT GASTRIC LAVAGE 34FR ADT (SET/KITS/TRAYS/PACK) ×3 IMPLANT
MARKER SKIN DUAL TIP RULER LAB (MISCELLANEOUS) ×3 IMPLANT
NDL SPNL 22GX3.5 QUINCKE BK (NEEDLE) ×2 IMPLANT
NEEDLE SPNL 22GX3.5 QUINCKE BK (NEEDLE) ×3 IMPLANT
NS IRRIG 1000ML POUR BTL (IV SOLUTION) ×3 IMPLANT
PACK CARDIOVASCULAR III (CUSTOM PROCEDURE TRAY) ×3 IMPLANT
POUCH SPECIMEN RETRIEVAL 10MM (ENDOMECHANICALS) IMPLANT
RELOAD 45 VASCULAR/THIN (ENDOMECHANICALS) ×6 IMPLANT
RELOAD BLUE (STAPLE) ×3 IMPLANT
RELOAD ENDO STITCH 2.0 (ENDOMECHANICALS) ×24
RELOAD GOLD (STAPLE) IMPLANT
RELOAD STAPLE 45 2.5 WHT GRN (ENDOMECHANICALS) IMPLANT
RELOAD STAPLE 45 3.5 BLU ETS (ENDOMECHANICALS) IMPLANT
RELOAD STAPLE TA45 3.5 REG BLU (ENDOMECHANICALS) ×6 IMPLANT
RELOAD SUT SNGL STCH ABSRB 2-0 (ENDOMECHANICALS) IMPLANT
RELOAD SUT SNGL STCH BLK 2-0 (ENDOMECHANICALS) ×8 IMPLANT
RELOAD WHITE ECR60W (STAPLE) IMPLANT
SCALPEL HARMONIC ACE (MISCELLANEOUS) ×3 IMPLANT
SCISSORS LAP 5X35 DISP (ENDOMECHANICALS) ×3 IMPLANT
SEALANT SURGICAL APPL DUAL CAN (MISCELLANEOUS) ×3 IMPLANT
SET IRRIG TUBING LAPAROSCOPIC (IRRIGATION / IRRIGATOR) ×3 IMPLANT
SLEEVE ENDOPATH XCEL 5M (ENDOMECHANICALS) ×6 IMPLANT
SOLUTION ANTI FOG 6CC (MISCELLANEOUS) ×3 IMPLANT
SPONGE GAUZE 4X4 12PLY (GAUZE/BANDAGES/DRESSINGS) ×3 IMPLANT
STAPLE ECHEON FLEX 60 POW ENDO (STAPLE) ×3 IMPLANT
STAPLER VISISTAT 35W (STAPLE) ×3 IMPLANT
SUT MON AB 5-0 PS2 18 (SUTURE) ×3 IMPLANT
SUT RELOAD ENDO STITCH 2 48X1 (ENDOMECHANICALS) ×4
SUT RELOAD ENDO STITCH 2.0 (ENDOMECHANICALS) ×12
SUT VIC AB 2-0 SH 27 (SUTURE) ×3
SUT VIC AB 2-0 SH 27X BRD (SUTURE) ×2 IMPLANT
SUTURE RELOAD END STTCH 2 48X1 (ENDOMECHANICALS) ×4 IMPLANT
SUTURE RELOAD ENDO STITCH 2.0 (ENDOMECHANICALS) ×12 IMPLANT
SYR 20CC LL (SYRINGE) ×3 IMPLANT
SYR 50ML LL SCALE MARK (SYRINGE) ×3 IMPLANT
SYR CONTROL 10ML LL (SYRINGE) ×3 IMPLANT
TOWEL OR 17X26 10 PK STRL BLUE (TOWEL DISPOSABLE) ×3 IMPLANT
TRAY FOLEY CATH 14FRSI W/METER (CATHETERS) ×3 IMPLANT
TROCAR BLADELESS OPT 5 100 (ENDOMECHANICALS) ×3 IMPLANT
TROCAR XCEL 12X100 BLDLESS (ENDOMECHANICALS) ×3 IMPLANT
TROCAR XCEL NON-BLD 11X100MML (ENDOMECHANICALS) ×3 IMPLANT
TUBING ENDO SMARTCAP (MISCELLANEOUS) ×3 IMPLANT
TUBING FILTER THERMOFLATOR (ELECTROSURGICAL) ×3 IMPLANT
WATER STERILE IRR 1500ML POUR (IV SOLUTION) ×3 IMPLANT

## 2013-09-09 NOTE — Interval H&P Note (Signed)
History and Physical Interval Note:  09/09/2013 1:25 PM  Tracy Barry  has presented today for surgery, with the diagnosis of morbid obesity   The various methods of treatment have been discussed with the patient and family.   Dr. Daphine Deutscher saw [name] at the pre op visit because I was caught up in an emergency operation.  I called the patient last night and spoke to her for about 20 minutes and answered questions that she had.  She is in good spirits today and is ready for surgery.  Her mother is here with her.  After consideration of risks, benefits and other options for treatment, the patient has consented to  Procedure(s): LAPAROSCOPIC ROUX-EN-Y GASTRIC BYPASS WITH UPPER ENDOSCOPY (N/A) as a surgical intervention .    The patient's history has been reviewed, patient examined, no change in status, stable for surgery.  I have reviewed the patient's chart and labs.  Questions were answered to the patient's satisfaction.     Hermila Millis H

## 2013-09-09 NOTE — Op Note (Signed)
Tracy Barry [id number] [birth year] 09/09/2013  Preoperative diagnosis: morbid obesity  Postoperative diagnosis: Same   Procedure: Upper endoscopy   Surgeon: Mary Sella. Dymphna Wadley M.D., FACS   Anesthesia: Gen.   Indications for procedure: 47 yo AAF undergoing a laparoscopic roux en y gastric bypass and an upper endoscopy was requested to evaluate the anastomosis.  Description of procedure: After we have completed the new gastrojejunostomy, I scrubbed out and obtained the Olympus endoscope. I gently placed endoscope in the patient's oropharynx and gently glided it down the esophagus without any difficulty under direct visualization. Once I was in the gastric pouch, I insufflated the pouch was air. The pouch was approximately 4 cm in size. I was able to cannulate and advanced the scope through the gastrojejunostomy. Dr.Newman had placed saline in the upper abdomen. Upon further insufflation of the gastric pouch there was no evidence of bubbles. Upon further inspection of the gastric pouch, the mucosa appeared normal. There is no evidence of any mucosal abnormality. The gastric pouch and Roux limb were decompressed. The width of the gastrojejunal anastomosis was at least 2.5 cm. The scope was withdrawn. The patient tolerated this portion of the procedure well. Please see Dr Allene Pyo operative note for details regarding the laparoscopic roux-en-y gastric bypass.  Mary Sella. Andrey Campanile, MD, FACS General, Bariatric, & Minimally Invasive Surgery [facility], [place]

## 2013-09-09 NOTE — Transfer of Care (Signed)
Immediate Anesthesia Transfer of Care Note  Patient: Tracy Barry  Procedure(s) Performed: Procedure(s) (LRB): LAPAROSCOPIC ROUX-EN-Y GASTRIC BYPASS WITH UPPER ENDOSCOPY (N/A) UPPER GI ENDOSCOPY  Patient Location: PACU  Anesthesia Type: General  Level of Consciousness: sedated, patient cooperative and responds to stimulation  Airway & Oxygen Therapy: Patient Spontanous Breathing and Patient connected to face mask oxgen  Post-op Assessment: Report given to PACU RN and Post -op Vital signs reviewed and stable  Post vital signs: Reviewed and stable  Complications: No apparent anesthesia complications

## 2013-09-09 NOTE — Op Note (Addendum)
PATIENT:   Tracy Barry DOB:   16-Jun-1966 MRN:   [id number]  DATE OF PROCEDURE: 09/09/2013                   FACILITY:  [facility]  OPERATIVE REPORT  PREOPERATIVE DIAGNOSIS:  Morbid obesity.   3 liver lesions seen on [place] (largest 2.5 cm).  POSTOPERATIVE DIAGNOSIS:  Morbid obesity (weight 257, BMI of 44.2).   No obvious liver lesion on laparoscopy.  Adhesions to anterior abdominal wall and left lower quadrant.  PROCEDURE:  Laparoscopic Roux-en-Y gastric bypass (intraoperative upper endoscopy by Dr. Fredonia Highland).  Enterolysis of adhesions, approximately 15 minutes.  SURGEON:  Sandria Bales. Ezzard Standing, MD  FIRST ASSISTANTFredonia Highland, MD  ANESTHESIA:  General endotracheal.  Anesthesiologist: Einar Pheasant, MD CRNA: Mechele Dawley, CRNA; Peggy Williford  General  ESTIMATED BLOOD LOSS:  Minimal.  LOCAL ANESTHESIA:  30 cc of 1/4% Marcaine  COMPLICATIONS:  None.  INDICATION FOR SURGERY:  Tracy Barry is a 47 y.o. AA  female who sees Pershing Cox, MD as her primary care doctor.  She has completed our preoperative bariatric program and now comes for a laparoscopic Roux-en-Y gastric bypass.  The indications, potential complications of surgery were explained to the patient.  Potential complications of the surgery include, but are not limited to, bleeding, infection, DVT, open surgery, and long-term nutritional consequences.  OPERATIVE NOTE:  The patient taken to room #1 at [facility] where Ms. Gerilyn Pilgrim underwent a general endotracheal anesthetic, supervised by Anesthesiologist: Einar Pheasant, MD CRNA: Mechele Dawley, CRNA; Peggy Williford.  The patient was given 2 g of cefoxitin at the beginning of the procedure.  A time-out was held and surgical checklist run.  The abdomen was prepped with ChloraPrep and sterilely draped.  I accessed the abdominal cavity through the left upper quadrant using a 12 mm Optiview trocar.  I placed 6 additional trocars: 5 mm subxiphoid, 12 mm right  subcostal, 12 mm right paramedian, 12 mm left paramedian, 5 mm lateral subcostal, and a 11 mm below to the right of the umbilicus.  The abdomen was insufflated and abdominal exploration carried out.  The patient's pre op [place] suggested 3 liver lesions, mainly in the central right lobe  Right and left lobes of liver unremarkable.  I saw no obvious surface lesion.  The stomach that I could see was unremarkable.  The patient had omentum stuck under the area of the umbilicus.  I think these were adhesions to previous surgery.  To access the adhesions, I placed and additional right sided 5 mm port.  I took down the omentum and saw no obvious hernia.  This enterolysis took 15 minutes.  Ms. Siegenthaler also had small bowel stuck in the left lower quadrant.  This was distal small bowel and did not affect the small bowel needed for the gastric bypass.  I was able to push the omentum and transverse colon up and identified the ligament of Treitz to start the operation.  I measured 40 cm of the jejunum, starting at the ligament of Tritz, and divided the jejunum with a white load of 45 mm Ethicon Endo-GIA stapler.  I divided a short length into the mesentery.  I measured 100 cm of jejunum for the future gastric limb.  I put a Penrose drain on the future gastric limb of the jejunum.  I then did a side-to-side jejunojejunostomy.  I used a 45 mm white load of the Ethicon Endo-GIA stapler.  I closed the enterotomy with 2 running 2-0 Vicryl sutures.  I tested the JJ anastomosis with an alligator forceps and then covered this with Tisseel.  I closed the mesenteric defect with a running 2-0 silk suture with a Laparo-tye on each end.  I then divided the omentum with a Harmonic Scalpel.  I positioned the patient in reverse Trendelenburg and placed the liver retractor, which was introduced into the peritoneal cavity through a subxiphoid 5 mm trocar puncture, under the left lobe of the liver.  The patient had a large left lobe of the  liver.  I then identified the gastroesophageal junction.  I went to the left at the angle of His and made a window at the left side of the esophago-gastric junction for a target as my dissection.  I then went on the lesser curve of the stomach, measured 5 cm from the gastroesophageal junction down the lesser curve and dissected into the lesser sac from the lesser curvature side of the stomach.  I did the first firing of a 45 mm blue load Ethicon Endo-GIA stapler and then did 3 firings of the 60 mm blue load Ethicon Eschelon stapler.  This created a gastric pouch approximately 5 cm in length and 3 cm in width.  There was no bleeding from either the pouch or the stomach remnant site.  I placed Tisseel on the pouch side along the new greater curvature.  I over sewed the gastric remnant with a locking 2-0 Vicryl suture with a Laparo-tye on each end..  I then brought the jejunum ante-colic, ante-gastric up to the new stomach pouch and placed a posterior running 2-0 Vicryl suture.  I then made an enterotomy into the stomach using the Ewald as a back stop and an enterotomy into the jejunum.  I did a stapled side-to-side gastrojejunal anastomosis using these two enterotomies with a 45 mm blue load of the Ethicon Endo GIA stapler.  I tried to create a 2.5 cm gastrojejunal anastomosis.  I closed the enterotomy with a 2 running 2-0 Vicryl sutures.  I passed the Ewald tube through the gastrojejunal anastomosis and then did an anterior Connell suture running of 2-0 Vicryl suture for the anterior layer of the gastrojejunostomy.  The Ewald tube was then removed without difficulty.  I then closed the [place] defect with a figure-of-eight 2-0 silk suture between the mesentery of the transverse colon and the mesentery of the distal jejunum.  Dr. Andrey Campanile then scrubbed out and did an intraoperative upper endoscopy.  He identified the esophagogastric junction about 40 cm, the gastrojejunal anastomosis about 44 cm.  I  clamped off the small bowel.  He insufflated air and I flooded the abdomen with saline. There was no bubbling or evidence of air leak.  He then withdrew the scope and he will dictate that portion of the operation.    I then re-inspected the anastomoses, sucked out the saline, placed Tisseel over the stomach pouch and gastrojejunal anastomosis.   The liver retractor was removed.  The trocars were removed.  There was no bleeding at any trocar site.  The skin at each trocar site was closed with a 5-0 Monocryl suture.  I infiltrated a total about 30 cc of 0.25% Marcaine at the trocar sites.    After the skin incisions were closed with sutures they were painted with Dermabond.  The sponge and needle count were correct at the end of the case.  The patient tolerated the procedure well, was transported to the  recovery room in good condition.   Ovidio Kin, MD, [facility] Pager: [phone] Office phone:  [phone]

## 2013-09-09 NOTE — Anesthesia Preprocedure Evaluation (Addendum)
Anesthesia Evaluation  Patient identified by MRN, date of birth, ID band Patient awake    Reviewed: Allergy & Precautions, H&P , NPO status , Patient's Chart, lab work & pertinent test results  Airway Mallampati: II TM Distance: >3 FB Neck ROM: full    Dental no notable dental hx. (+) Teeth Intact and Dental Advisory Given   Pulmonary sleep apnea and Continuous Positive Airway Pressure Ventilation ,  OSA dx in the "past". Does not know her #. Does not have her mask. Had problems with SaO2 after surgery   Pulmonary exam normal       Cardiovascular hypertension, Pt. on medications Rhythm:Regular Rate:Normal     Neuro/Psych  Headaches, PSYCHIATRIC DISORDERS Anxiety    GI/Hepatic Neg liver ROS, GERD-  Medicated,  Endo/Other  diabetes, Type 2, Oral Hypoglycemic AgentsMorbid obesity  Renal/GU negative Renal ROS     Musculoskeletal negative musculoskeletal ROS (+)   Abdominal (+) + obese,   Peds  Hematology negative hematology ROS (+)   Anesthesia Other Findings   Reproductive/Obstetrics negative OB ROS                          Anesthesia Physical  Anesthesia Plan  ASA: III  Anesthesia Plan: General   Post-op Pain Management:    Induction: Intravenous  Airway Management Planned: Oral ETT  Additional Equipment:   Intra-op Plan:   Post-operative Plan: Extubation in OR  Informed Consent: I have reviewed the patients History and Physical, chart, labs and discussed the procedure including the risks, benefits and alternatives for the proposed anesthesia with the patient or authorized representative who has indicated his/her understanding and acceptance.   Dental Advisory Given  Plan Discussed with: CRNA  Anesthesia Plan Comments:         Anesthesia Quick Evaluation

## 2013-09-09 NOTE — H&P (View-Only) (Signed)
 Re:   Tracy Barry DOB:   07/30/1966 MRN:   [id number]  ASSESSMENT AND PLAN: 1.  Morbid obesity  Initial weight - 245, BMI - 41.9  Per the 1991 NIH Consensus Statement, the patient is a candidate for bariatric surgery. She was originally seen in [place] by Dr. T. Walsh.  She had a 4 month diet by [employer].   The patient attended our initial information session and reviewed the types of bariatric surgery.    The patient is interested in the Roux en Y Gastric Bypass.  I discussed with the patient the indications and risks of bariatric surgery.  The potential risks of surgery include, but are not limited to, bleeding, infection, leak from the bowel, DVT and PE, open surgery, long term nutrition consequences, and death.  The patient understands the importance of compliance and long term follow-up with our group after surgery.  From here we will obtain lab tests, x-rays, and nutrition consult.  She has had a psych consult.  She'll bring her brother to the next visit.  2.  Hypertension 3.  Diabetes mellitus x 4 years 4.  OSA - on CPAP 5.  GERD 6.  History of anxiety 7.  History of bipolar disease.  Morbid obesity with co morbidities   REFERRING PHYSICIAN: WALSH,CATHERINE, MD  HISTORY OF PRESENT ILLNESS: Tracy Barry is a 47 y.o. (DOB: 06/04/1966)  AA  female whose primary care physician is WALSH,CATHERINE, MD and comes to me today for weight loss surgery.  She came by herself today.  The patient has already been evaluated at [facility] by Dr. Tom Walsh.  [employer] required a 4 month diet - which she has completed.  So, she has already been through the process once.  She said that Dr. Walsh talked primarily about the sleeve gastrectomy, but now she wants to consider the RYGB.  She has also been to our information session. She is not sure who talked at it.  The patient has tried multiple diets including Weight Watchers at least 4 times, Adkins diet, Jenny Craig diet, Nutrisystem  diet, [place] diet, and the 17 day diet. She has tried Sensa based diet.  She has not tried a prescription pill. She worked at [employer] and knew some people who had bariatric surgery who were successful with weight loss and some who were not.  Past Medical History  Diagnosis Date  . Hypertension   . Complication of anesthesia     decreased sa02 after anesthesia  . Anxiety   . Diabetes mellitus without complication   . Sleep apnea   . Headache(784.0)   . GERD (gastroesophageal reflux disease)     does not take medication to treat  . Congestion of upper airway       Past Surgical History  Procedure Laterality Date  . Myomectomy  2004  . Dilation and curettage of uterus    . Shoulder surgery    . Robotic assisted total hysterectomy  10/17/2012    Procedure: ROBOTIC ASSISTED TOTAL HYSTERECTOMY;  Surgeon: Marie-Lyne Lavoie, MD;  Location: [facility];  Service: Gynecology;  Laterality: N/A;  with bilateral salpingectomy; extensive lysis of adhesions  . Breath tek h pylori N/A 06/27/2013    Procedure: BREATH TEK H PYLORI;  Surgeon: David H Newman, MD;  Location: [facility];  Service: General;  Laterality: N/A;     Current Outpatient Prescriptions  Medication Sig Dispense Refill  . Chlorphen-Phenyleph-ASA (ALKA-SELTZER PLUS COLD PO) Take 1 tablet   by mouth daily as needed (cold). For cold symptoms      . clonazePAM (KLONOPIN) 1 MG tablet Take 1 mg by mouth daily.      . doxepin (SINEQUAN) 150 MG capsule Take 150 mg by mouth at bedtime.      . hydrochlorothiazide (MICROZIDE) 12.5 MG capsule Take 12.5 mg by mouth daily with breakfast.       . lamoTRIgine (LAMICTAL) 150 MG tablet Take 150 mg by mouth 2 (two) times daily.      . metFORMIN (GLUCOPHAGE) 500 MG tablet Take 500 mg by mouth 2 (two) times daily.       . minoxidil (ROGAINE) 2 % external solution Apply 1 application topically 2 (two) times daily.      . Pseudoeph-Doxylamine-DM-APAP (NYQUIL PO) Take 30 mLs by mouth daily as  needed (cold).       No current facility-administered medications for this visit.    No Known Allergies  REVIEW OF SYSTEMS: Skin:  History of abdominoplasty in 2006. Infection:  No history of hepatitis or HIV.  No history of MRSA. Neurologic:  No history of stroke.  No history of seizure.  No history of headaches. Cardiac:  Hypertension. No history of heart disease.  No history of prior cardiac catheterization.  No history of seeing a cardiologist. Pulmonary:  Asthma.  OSA x 6 years, but on CPAP x 2 months.  Endocrine:  Diabetes mellitus x 4 years.. No thyroid disease. Gastrointestinal:  She does have some GERD..  No history of liver disease.  No history of gall bladder disease.  No history of pancreas disease.  No history of colon disease. Urologic:  No history of kidney stones.  No history of bladder infections. GYN:  Hysterectomy (only) by Dr. Lavoie 10/17/2102. Musculoskeletal:  No history of joint or back disease. Hematologic:  No bleeding disorder.  No history of anemia.  Not anticoagulated. Psycho-social:  History of anxiety. History of bipolar disease - sees [facility] - Lisa Poulis (prescribes)/Donna Hood (counselor).  Saw Dr. T. Thompson 03/27/2013.  SOCIAL and FAMILY HISTORY: Unmarried. Lives with brother. On disability x 1 year from [employer] for bipolar/anxiety.  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  PHYSICAL EXAM: LMP 10/01/2012  General: Obese AA female who is alert and generally healthy appearing.  HEENT: Normal. Pupils equal. Neck: Supple. No mass.  No thyroid mass. Lymph Nodes:  No supraclavicular or cervical nodes. Lungs: Clear to auscultation and symmetric breath sounds. Heart:  RRR. No murmur or rub.  Abdomen: Soft. No mass. No tenderness. No hernia. Normal bowel sounds.  Has scar from abdominoplasty about 8 years ago.  Has laparoscopic incision scars. She is a little more pear than apple. Rectal: Guaiac negative stool. Extremities:  Good strength and  ROM  in upper and lower extremities. Neurologic:  Grossly intact to motor and sensory function. Psychiatric: Has normal mood and affect. Behavior is normal.   DATA REVIEWED: Notes from [place] and Epic notes.  The patient came in today and Dr. Newman was called away. I discussed Roux-en-Y gastric bypass and answered questions that she had about sleeve gastrectomy. She reports no changes in her comorbidities were physical exam. Given her bowel prep instructions and GoLYTELY and erythromycin et cetera. She is set for 1:00 on December 2. Having no further questions she was sent home and will be read for a Roux-en-Y gastric bypass on December 2.  Matt B. Markas Aldredge, MD, FACS  [facility] [phone] beeper [phone]    08/28/2013 2:16 PM  

## 2013-09-09 NOTE — Anesthesia Postprocedure Evaluation (Signed)
Anesthesia Post Note  Patient: Tracy Barry  Procedure(s) Performed: Procedure(s) (LRB): LAPAROSCOPIC ROUX-EN-Y GASTRIC BYPASS WITH UPPER ENDOSCOPY (N/A) UPPER GI ENDOSCOPY  Anesthesia type: General  Patient location: PACU  Post pain: Pain level controlled  Post assessment: Post-op Vital signs reviewed  Last Vitals:  Filed Vitals:   09/09/13 1815  BP: 164/84  Pulse: 102  Temp:   Resp: 12    Post vital signs: Reviewed  Level of consciousness: sedated  Complications: No apparent anesthesia complications

## 2013-09-09 NOTE — Telephone Encounter (Signed)
Call report for CXR paged to Dr. Ezzard Standing at this time.

## 2013-09-10 ENCOUNTER — Inpatient Hospital Stay (HOSPITAL_COMMUNITY): Payer: Managed Care, Other (non HMO)

## 2013-09-10 ENCOUNTER — Encounter (HOSPITAL_COMMUNITY): Payer: Self-pay | Admitting: Surgery

## 2013-09-10 DIAGNOSIS — Z9889 Other specified postprocedural states: Secondary | ICD-10-CM

## 2013-09-10 LAB — CBC WITH DIFFERENTIAL/PLATELET
Basophils Absolute: 0 10*3/uL (ref 0.0–0.1)
Basophils Relative: 0 % (ref 0–1)
Eosinophils Absolute: 0 10*3/uL (ref 0.0–0.7)
Eosinophils Relative: 0 % (ref 0–5)
HCT: 36.4 % (ref 36.0–46.0)
Lymphocytes Relative: 9 % — ABNORMAL LOW (ref 12–46)
MCHC: 33.5 g/dL (ref 30.0–36.0)
MCV: 85.2 fL (ref 78.0–100.0)
Monocytes Absolute: 0.4 10*3/uL (ref 0.1–1.0)
Monocytes Relative: 3 % (ref 3–12)
Platelets: 317 10*3/uL (ref 150–400)
RDW: 15 % (ref 11.5–15.5)
WBC: 14.1 10*3/uL — ABNORMAL HIGH (ref 4.0–10.5)

## 2013-09-10 LAB — GLUCOSE, CAPILLARY
Glucose-Capillary: 128 mg/dL — ABNORMAL HIGH (ref 70–99)
Glucose-Capillary: 102 mg/dL — ABNORMAL HIGH (ref 70–99)

## 2013-09-10 LAB — HEMOGLOBIN AND HEMATOCRIT, BLOOD: HCT: 35.2 % — ABNORMAL LOW (ref 36.0–46.0)

## 2013-09-10 MED ORDER — OXYCODONE HCL 5 MG/5ML PO SOLN
5.0000 mg | ORAL | Status: DC | PRN
  Administered 2013-09-10 – 2013-09-11 (×4): 10 mg via ORAL
  Filled 2013-09-10 (×5): qty 10

## 2013-09-10 MED ORDER — IOHEXOL 300 MG/ML  SOLN
50.0000 mL | Freq: Once | INTRAMUSCULAR | Status: AC | PRN
  Administered 2013-09-10: 50 mL via INTRAVENOUS

## 2013-09-10 NOTE — Progress Notes (Signed)
General Surgery Note  LOS: 1 day  POD -  1 Day Post-Op  Assessment/Plan: 1.  Morbid obesity  LAPAROSCOPIC ROUX-EN-Y GASTRIC BYPASS WITH UPPER ENDOSCOPY - 09/09/2013 - D. Dandrea Medders  For UGI and dopplers today.  Looks good.  2. Hypertension  3. Diabetes mellitus x 4 years   Glucose - 144 4. OSA - on CPAP  5. GERD  6. History of anxiety  7. History of bipolar disease. 8.  US shows liver lesions - needs further imaging 9.  CXR suggested to be repeated 10.  DVT prophylaxis - SQ heparin   Active Problems:   Morbid obesity, Initial weight - 245, BMI - 41.9  Subjective:  Some pain.  No specific complaint.  Mother in room. Objective:   Filed Vitals:   09/10/13 0547  BP: 124/80  Pulse: 91  Temp: 98.5 F (36.9 C)  Resp: 18     Intake/Output from previous day:  12/02 0701 - 12/03 0700 In: 4497.5 [I.V.:4497.5] Out: 4100 [Urine:4100]  Intake/Output this shift:      Physical Exam:   General: Obese AA F who is alert and oriented.    HEENT: Normal. Pupils equal. .   Lungs: Clear.   Abdomen: Quiet.   Wound: Look clean.     Lab Results:    Recent Labs  09/09/13 2050 09/10/13 0545  WBC  --  14.1*  HGB 12.2 12.2  HCT 36.3 36.4  PLT  --  317    BMET  No results found for this basename: NA, K, CL, CO2, GLUCOSE, BUN, CREATININE, CALCIUM,  in the last 72 hours  PT/INR  No results found for this basename: LABPROT, INR,  in the last 72 hours  ABG  No results found for this basename: PHART, PCO2, PO2, HCO3,  in the last 72 hours   Studies/Results:  Dg Chest 2 View  09/09/2013   CLINICAL DATA:  Diabetes.  Hypertension.  Preop gastric bypass.  EXAM: CHEST  2 VIEW  COMPARISON:  Chest x-ray 04/12/2012.  FINDINGS: Mediastinum and hilar structures are normal. Questionable nodular density noted projected over the right mid lung field on PA view only. This is most likely overlapping structures . A repeat PA and lateral chest x-ray suggested. Lungs are clear of infiltrates. Heart size  normal. No focal bony abnormality.  IMPRESSION: 1. Nodular density projected over right mid lung field most likely overlapping structures. Repeat PA and lateral chest x-ray suggested. 2. No acute cardiopulmonary disease. These results will be called to the ordering clinician or representative by the Radiologist Assistant, and communication documented in the PACS Dashboard.   Electronically Signed   By: Maisie Fus  Register   On: 09/09/2013 15:11     Anti-infectives:   Anti-infectives   Start     Dose/Rate Route Frequency Ordered Stop   09/09/13 1059  cefOXitin (MEFOXIN) 2 g in dextrose 5 % 50 mL IVPB     2 g 100 mL/hr over 30 Minutes Intravenous On call to O.R. 09/09/13 1059 09/09/13 1434      Ovidio Kin, MD, FACS Pager: [phone] [facility] Office: [phone] 09/10/2013

## 2013-09-10 NOTE — Care Management Note (Signed)
    Page 1 of 1   09/10/2013     11:06:54 AM   CARE MANAGEMENT NOTE 09/10/2013  Patient:  Tracy Barry, Tracy Barry   Account Number:  [id number]  Date Initiated:  09/10/2013  Documentation initiated by:  Lorenda Ishihara  Subjective/Objective Assessment:   47 yo female admitted s/p gastric bypass. PTA lived at home with brother.     Action/Plan:   Home when stable   Anticipated DC Date:  09/10/2013   Anticipated DC Plan:  HOME/SELF CARE      DC Planning Services  CM consult      Choice offered to / List presented to:             Status of service:  Completed, signed off Medicare Important Message given?   (If response is "NO", the following Medicare IM given date fields will be blank) Date Medicare IM given:   Date Additional Medicare IM given:    Discharge Disposition:  HOME/SELF CARE  Per UR Regulation:  Reviewed for med. necessity/level of care/duration of stay  If discussed at Long Length of Stay Meetings, dates discussed:    Comments:

## 2013-09-10 NOTE — Progress Notes (Signed)
VASCULAR LAB PRELIMINARY  PRELIMINARY  PRELIMINARY  PRELIMINARY  Bilateral lower extremity venous duplex completed.    Preliminary report:  Bilateral:  No evidence of DVT, superficial thrombosis, or Baker's Cyst.   Tracy Barry, RVS 09/10/2013, 9:29 AM

## 2013-09-10 NOTE — Progress Notes (Signed)
Pt stated that she was comfortable self administering her CPAP tonight.  Pt encouraged to notify RT if any complications.  RT to monitor and assess as needed.

## 2013-09-11 LAB — GLUCOSE, CAPILLARY
Glucose-Capillary: 107 mg/dL — ABNORMAL HIGH (ref 70–99)
Glucose-Capillary: 121 mg/dL — ABNORMAL HIGH (ref 70–99)
Glucose-Capillary: 123 mg/dL — ABNORMAL HIGH (ref 70–99)
Glucose-Capillary: 133 mg/dL — ABNORMAL HIGH (ref 70–99)
Glucose-Capillary: 91 mg/dL (ref 70–99)

## 2013-09-11 LAB — CBC WITH DIFFERENTIAL/PLATELET
HCT: 33.2 % — ABNORMAL LOW (ref 36.0–46.0)
Hemoglobin: 10.9 g/dL — ABNORMAL LOW (ref 12.0–15.0)
Lymphocytes Relative: 11 % — ABNORMAL LOW (ref 12–46)
Lymphs Abs: 1.7 10*3/uL (ref 0.7–4.0)
Monocytes Absolute: 1.2 10*3/uL — ABNORMAL HIGH (ref 0.1–1.0)
Monocytes Relative: 8 % (ref 3–12)
Neutro Abs: 12.9 10*3/uL — ABNORMAL HIGH (ref 1.7–7.7)
Neutrophils Relative %: 82 % — ABNORMAL HIGH (ref 43–77)
RBC: 3.86 MIL/uL — ABNORMAL LOW (ref 3.87–5.11)
RDW: 15.4 % (ref 11.5–15.5)
WBC: 15.9 10*3/uL — ABNORMAL HIGH (ref 4.0–10.5)

## 2013-09-11 NOTE — Progress Notes (Signed)
Patient alert and oriented, pain is controlled. Patient is tolerating fluids, plan to advance to protein shake today.  Reviewed Gastric Bypass discharge instructions with patient and patient is able to articulate understanding.  Provided information on BELT program, Support Groups and [employer].  All questions answered.  Will Continue to Monitor.

## 2013-09-11 NOTE — Discharge Summary (Signed)
Physician Discharge Summary  Patient ID:  Tracy Barry  MRN: [id number]  DOB/AGE: [birth year] 47 y.o.  Admit date: 09/09/2013 Discharge date: 09/11/2013  Discharge Diagnoses:  1. Morbid obesity   Initial weight - 245, BMI - 41.9 2. Hypertension  3. Diabetes mellitus x 4 years  4. OSA - on CPAP  5. GERD  6. History of anxiety  7. History of bipolar disease.  8. US shows liver lesions - needs further imaging   Copies of the report given to patient. 9. CXR suggested to be repeated   Copies of the report given to patient.   Active Problems:   Morbid obesity, Initial weight - 245, BMI - 41.9   Operation: Procedure(s): LAPAROSCOPIC ROUX-EN-Y GASTRIC BYPASS WITH UPPER ENDOSCOPY, UPPER GI ENDOSCOPY on 09/09/2013 - D. Ezzard Standing  Discharged Condition: good  Hospital Course: Tracy Barry is an 47 y.o. female whose primary care physician is Pershing Cox, MD and who was admitted 09/09/2013 with a chief complaint of morbid obesity.   She was brought to the operating room on 09/09/2013 and underwent  LAPAROSCOPIC ROUX-EN-Y GASTRIC BYPASS WITH UPPER ENDOSCOPY.   She has done well post op.  Her swallow on the first post op day was okay.  Her dopplers of her lower extremities were normal.  She is now 2 days post op.  She is tolerating protein drinks and ready to go home.  The discharge instructions were reviewed with the patient.  Consults: None  Significant Diagnostic Studies: Results for orders placed during the hospital encounter of 09/09/13  GLUCOSE, CAPILLARY      Result Value Range   Glucose-Capillary 110 (*) 70 - 99 mg/dL   Comment 1 Documented in Chart    GLUCOSE, CAPILLARY      Result Value Range   Glucose-Capillary 135 (*) 70 - 99 mg/dL   Comment 1 Notify RN    HEMOGLOBIN AND HEMATOCRIT, BLOOD      Result Value Range   Hemoglobin 12.2  12.0 - 15.0 g/dL   HCT 40.9  81.1 - 91.4 %  CBC WITH DIFFERENTIAL      Result Value Range   WBC 14.1 (*) 4.0 - 10.5 K/uL   RBC  4.27  3.87 - 5.11 MIL/uL   Hemoglobin 12.2  12.0 - 15.0 g/dL   HCT 78.2  95.6 - 21.3 %   MCV 85.2  78.0 - 100.0 fL   MCH 28.6  26.0 - 34.0 pg   MCHC 33.5  30.0 - 36.0 g/dL   RDW 08.6  57.8 - 46.9 %   Platelets 317  150 - 400 K/uL   Neutrophils Relative % 89 (*) 43 - 77 %   Neutro Abs 12.5 (*) 1.7 - 7.7 K/uL   Lymphocytes Relative 9 (*) 12 - 46 %   Lymphs Abs 1.2  0.7 - 4.0 K/uL   Monocytes Relative 3  3 - 12 %   Monocytes Absolute 0.4  0.1 - 1.0 K/uL   Eosinophils Relative 0  0 - 5 %   Eosinophils Absolute 0.0  0.0 - 0.7 K/uL   Basophils Relative 0  0 - 1 %   Basophils Absolute 0.0  0.0 - 0.1 K/uL  GLUCOSE, CAPILLARY      Result Value Range   Glucose-Capillary 149 (*) 70 - 99 mg/dL  HEMOGLOBIN AND HEMATOCRIT, BLOOD      Result Value Range   Hemoglobin 11.6 (*) 12.0 - 15.0 g/dL   HCT 62.9 (*) 52.8 -  46.0 %  GLUCOSE, CAPILLARY      Result Value Range   Glucose-Capillary 144 (*) 70 - 99 mg/dL  GLUCOSE, CAPILLARY      Result Value Range   Glucose-Capillary 144 (*) 70 - 99 mg/dL  GLUCOSE, CAPILLARY      Result Value Range   Glucose-Capillary 128 (*) 70 - 99 mg/dL  GLUCOSE, CAPILLARY      Result Value Range   Glucose-Capillary 120 (*) 70 - 99 mg/dL  GLUCOSE, CAPILLARY      Result Value Range   Glucose-Capillary 102 (*) 70 - 99 mg/dL  CBC WITH DIFFERENTIAL      Result Value Range   WBC 15.9 (*) 4.0 - 10.5 K/uL   RBC 3.86 (*) 3.87 - 5.11 MIL/uL   Hemoglobin 10.9 (*) 12.0 - 15.0 g/dL   HCT 16.1 (*) 09.6 - 04.5 %   MCV 86.0  78.0 - 100.0 fL   MCH 28.2  26.0 - 34.0 pg   MCHC 32.8  30.0 - 36.0 g/dL   RDW 40.9  81.1 - 91.4 %   Platelets 291  150 - 400 K/uL   Neutrophils Relative % 82 (*) 43 - 77 %   Neutro Abs 12.9 (*) 1.7 - 7.7 K/uL   Lymphocytes Relative 11 (*) 12 - 46 %   Lymphs Abs 1.7  0.7 - 4.0 K/uL   Monocytes Relative 8  3 - 12 %   Monocytes Absolute 1.2 (*) 0.1 - 1.0 K/uL   Eosinophils Relative 0  0 - 5 %   Eosinophils Absolute 0.0  0.0 - 0.7 K/uL   Basophils  Relative 0  0 - 1 %   Basophils Absolute 0.0  0.0 - 0.1 K/uL  GLUCOSE, CAPILLARY      Result Value Range   Glucose-Capillary 102 (*) 70 - 99 mg/dL  GLUCOSE, CAPILLARY      Result Value Range   Glucose-Capillary 133 (*) 70 - 99 mg/dL  GLUCOSE, CAPILLARY      Result Value Range   Glucose-Capillary 123 (*) 70 - 99 mg/dL  GLUCOSE, CAPILLARY      Result Value Range   Glucose-Capillary 107 (*) 70 - 99 mg/dL   Comment 1 Notify RN     Comment 2 Documented in Chart    GLUCOSE, CAPILLARY      Result Value Range   Glucose-Capillary 121 (*) 70 - 99 mg/dL   Comment 1 Notify RN     Comment 2 Documented in Chart      Dg Chest 2 View  09/09/2013   CLINICAL DATA:  Diabetes.  Hypertension.  Preop gastric bypass.  EXAM: CHEST  2 VIEW  COMPARISON:  Chest x-ray 04/12/2012.  FINDINGS: Mediastinum and hilar structures are normal. Questionable nodular density noted projected over the right mid lung field on PA view only. This is most likely overlapping structures . A repeat PA and lateral chest x-ray suggested. Lungs are clear of infiltrates. Heart size normal. No focal bony abnormality.  IMPRESSION: 1. Nodular density projected over right mid lung field most likely overlapping structures. Repeat PA and lateral chest x-ray suggested. 2. No acute cardiopulmonary disease. These results will be called to the ordering clinician or representative by the Radiologist Assistant, and communication documented in the PACS Dashboard.   Electronically Signed   By: Maisie Fus  Register   On: 09/09/2013 15:11   Dg Kayleen Memos W/water Sol Cm  09/10/2013   CLINICAL DATA:  Postop day 1 gastric bypass.  EXAM: WATER  SOLUBLE UPPER GI SERIES  TECHNIQUE: Single-column upper GI series was performed using water soluble contrast.  CONTRAST:  50mL OMNIPAQUE IOHEXOL 300 MG/ML  SOLN  COMPARISON:  None.  FLUOROSCOPY TIME:  1 min 0 seconds.  FINDINGS: Scout view of the abdomen shows postoperative changes in the left abdomen. Minimal gaseous distention of  bowel.  Water-soluble contrast examination of the upper gastrointestinal tract shows postoperative changes of gastric bypass. Contrast flows readily into the proximal small bowel without filling of the bypassed stomach. No leak.  IMPRESSION: Postoperative changes of gastric bypass without complicating feature.   Electronically Signed   By: Leanna Battles M.D.   On: 09/10/2013 08:45    Discharge Exam:  Filed Vitals:   09/11/13 1016  BP: 114/75  Pulse: 72  Temp: 98.2 F (36.8 C)  Resp: 20    General: WN obese AA F who is alert.  Lungs: Clear to auscultation and symmetric breath sounds. Heart:  RRR. No murmur or rub. Abdomen: Soft. Incisions look good.  Discharge Medications:     Medication List    STOP taking these medications       metFORMIN 500 MG tablet  Commonly known as:  GLUCOPHAGE      TAKE these medications       ALKA-SELTZER PLUS COLD PO  Take 1 tablet by mouth daily as needed (cold). For cold symptoms     clonazePAM 1 MG tablet  Commonly known as:  KLONOPIN  Take 1 mg by mouth daily.     doxepin 150 MG capsule  Commonly known as:  SINEQUAN  Take 150 mg by mouth at bedtime.     hydrochlorothiazide 12.5 MG capsule  Commonly known as:  MICROZIDE  Take 12.5 mg by mouth daily with breakfast.     lamoTRIgine 150 MG tablet  Commonly known as:  LAMICTAL  Take 150 mg by mouth 2 (two) times daily.     minoxidil 2 % external solution  Commonly known as:  ROGAINE  Apply 1 application topically 2 (two) times daily.     NYQUIL PO  Take 30 mLs by mouth daily as needed (cold).        Disposition: 01-Home or Self Care      Discharge Orders   Future Appointments Provider Department Dept Phone   09/23/2013 3:30 PM [facility] Post-Op Class [facility] [phone]   Future Orders Complete By Expires   Increase activity slowly  As directed      Activity:  Driving - May drive in 2 or 3 days, if doing well.   Lifting -  take it easy for about 7 days, then no limit on lifting.  Wound Care:   May shower starting tomorrow.  Diet:  Follow post op bariatric diet.  Follow up appointment:  You have an appointment with Dr. Ezzard Standing. Call Dr. Allene Pyo office [facility]) at [phone] for any question.  Medications and dosages:  Resume your home medications.  You have a prescription for:  Oxycodone elixir.  Signed: Ovidio Kin, M.D., [facility] Office:  [phone]  09/11/2013, 12:45 PM

## 2013-09-18 ENCOUNTER — Other Ambulatory Visit: Payer: Self-pay | Admitting: Internal Medicine

## 2013-09-18 ENCOUNTER — Ambulatory Visit
Admission: RE | Admit: 2013-09-18 | Discharge: 2013-09-18 | Disposition: A | Discharge status: Home / Self Care | Payer: 59 | Source: Ambulatory Visit | Attending: Internal Medicine | Admitting: Internal Medicine

## 2013-09-18 DIAGNOSIS — R9389 Abnormal findings on diagnostic imaging of other specified body structures: Secondary | ICD-10-CM

## 2013-09-18 DIAGNOSIS — K769 Liver disease, unspecified: Secondary | ICD-10-CM

## 2013-09-22 ENCOUNTER — Other Ambulatory Visit: Payer: Self-pay | Admitting: Internal Medicine

## 2013-09-22 DIAGNOSIS — R911 Solitary pulmonary nodule: Secondary | ICD-10-CM

## 2013-09-23 ENCOUNTER — Encounter: Payer: Managed Care, Other (non HMO) | Attending: Surgery

## 2013-09-23 DIAGNOSIS — Z713 Dietary counseling and surveillance: Secondary | ICD-10-CM | POA: Insufficient documentation

## 2013-09-23 NOTE — Progress Notes (Signed)
Bariatric Class:  Appt start time: 1530 end time:  1630.  2 Week Post-Operative Nutrition Class  Patient was seen on 09/23/13 for Post-Operative Nutrition education at [facility].   Surgery date: 09/09/13  Surgery type: RYGB Starting weight at [facility]: 246 lbs on 06/27/13  Weight today: 236.5 lbs Weight change: 9.5 lbs Total weight lost: 9.5 lbs  TANITA  BODY COMP RESULTS  09/23/13   BMI (kg/m^2) 40.6   Fat Mass (lbs) 120.5   Fat Free Mass (lbs) 116.0   Total Body Water (lbs) 85.0   The following the learning objectives were met by the patient during this course:  Identifies Phase 3A (Soft, High Proteins) Dietary Goals and will begin from 2 weeks post-operatively to 2 months post-operatively  Identifies appropriate sources of fluids and proteins   States protein recommendations and appropriate sources post-operatively  Identifies the need for appropriate texture modifications, mastication, and bite sizes when consuming solids  Identifies appropriate multivitamin and calcium sources post-operatively  Describes the need for physical activity post-operatively and will follow MD recommendations  States when to call healthcare provider regarding medication questions or post-operative complications  Handouts given during class include:  Phase 3A: Soft, High Protein Diet Handout  Band Fill Guidelines Handout  Follow-Up Plan: Patient will follow-up at [facility] in 6 weeks for 8 week post-op nutrition visit for diet advancement per MD.

## 2013-09-23 NOTE — Patient Instructions (Signed)
Patient to follow Phase 3A-Soft, High Protein Diet and follow-up at [facility] in 6 weeks for 2 months post-op nutrition visit for diet advancement. 

## 2013-09-24 ENCOUNTER — Ambulatory Visit
Admission: RE | Admit: 2013-09-24 | Discharge: 2013-09-24 | Disposition: A | Discharge status: Home / Self Care | Payer: Managed Care, Other (non HMO) | Source: Ambulatory Visit | Attending: Internal Medicine | Admitting: Internal Medicine

## 2013-09-24 DIAGNOSIS — R911 Solitary pulmonary nodule: Secondary | ICD-10-CM

## 2013-09-24 MED ORDER — IOHEXOL 300 MG/ML  SOLN
75.0000 mL | Freq: Once | INTRAMUSCULAR | Status: AC | PRN
  Administered 2013-09-24: 75 mL via INTRAVENOUS

## 2013-09-26 ENCOUNTER — Encounter: Payer: Self-pay | Admitting: Internal Medicine

## 2013-09-26 ENCOUNTER — Ambulatory Visit (INDEPENDENT_AMBULATORY_CARE_PROVIDER_SITE_OTHER): Payer: Managed Care, Other (non HMO) | Admitting: Internal Medicine

## 2013-09-26 VITALS — BP 132/88 | HR 113 | Temp 97.9°F | Ht 64.0 in | Wt 234.0 lb

## 2013-09-26 DIAGNOSIS — R058 Other specified cough: Secondary | ICD-10-CM | POA: Insufficient documentation

## 2013-09-26 DIAGNOSIS — R918 Other nonspecific abnormal finding of lung field: Secondary | ICD-10-CM

## 2013-09-26 DIAGNOSIS — R05 Cough: Secondary | ICD-10-CM

## 2013-09-26 MED ORDER — FAMOTIDINE 20 MG PO TABS: ORAL_TABLET | ORAL | Status: DC

## 2013-09-26 MED ORDER — PANTOPRAZOLE SODIUM 40 MG PO TBEC: 40.0000 mg | DELAYED_RELEASE_TABLET | Freq: Every day | ORAL | Status: DC

## 2013-09-26 NOTE — Progress Notes (Signed)
   Subjective:    Patient ID: Tracy Barry, female    DOB: 1966/08/06  MRN: [id number]  HPI  40 yobf never smoker with onset  cough x late 2013 being considered for gastric bypass  Sep 09 2013 and had abn cxr /CT and referred to pulmonary 09/26/2013 by Dr Lonzo Cloud   09/26/2013 1st [facility] office visit/ Wert cc indolent onset cough x one year waxes and wanes s pattern > clears back of throat and up a tbsp white to yellow to brown mucus never bloody   most days wakes up at 3 am and clears throat and back to bed.   No obvious day to day or daytime variabilty or assoc sob  or cp or chest tightness, subjective wheeze overt sinus or hb symptoms. No unusual exp hx or h/o childhood pna/ asthma or knowledge of premature birth.  Sleeping ok without nocturnal  or early am exacerbation  of respiratory  c/o's or need for noct saba. Also denies any obvious fluctuation of symptoms with weather or environmental changes or other aggravating or alleviating factors except as outlined above   Current Medications, Allergies, Complete Past Medical History, Past Surgical History, Family History, and Social History were reviewed in [employer] record.   y.      Review of Systems  Constitutional: Negative for fever, chills and unexpected weight change.  HENT: Negative for congestion, dental problem, ear pain, nosebleeds, postnasal drip, rhinorrhea, sinus pressure, sneezing, sore throat, trouble swallowing and voice change.   Eyes: Negative for visual disturbance.  Respiratory: Positive for cough and shortness of breath. Negative for choking.   Cardiovascular: Negative for chest pain and leg swelling.  Gastrointestinal: Negative for vomiting, abdominal pain and diarrhea.  Genitourinary: Negative for difficulty urinating.       Heartburn Indigestion  Musculoskeletal: Negative for arthralgias.  Skin: Negative for rash.  Neurological: Negative for tremors, syncope and  headaches.  Hematological: Does not bruise/bleed easily.       Objective:   Physical Exam  Wt Readings from Last 3 Encounters:  09/26/13 234 lb (106.142 kg)  09/10/13 248 lb (112.492 kg)  09/10/13 248 lb (112.492 kg)      amb bf nad  HEENT: nl dentition, turbinates, and orophanx. Nl external ear canals without cough reflex   NECK :  without JVD/Nodes/TM/ nl carotid upstrokes bilaterally   LUNGS: no acc muscle use, clear to A and P bilaterally without cough on insp or exp maneuvers   CV:  RRR  no s3 or murmur or increase in P2, no edema   ABD:  soft and nontender with nl excursion in the supine position. No bruits or organomegaly, bowel sounds nl  MS:  warm without deformities, calf tenderness, cyanosis or clubbing  SKIN: warm and dry without lesions    NEURO:  alert, approp, no deficits      CT chest 09/24/13 1. Multiple pulmonary nodules suspicious for pulmonary metastatic  disease. Recommend metastatic workup.  2. No findings for primary lung neoplasm or adenopathy in the chest        Assessment & Plan:

## 2013-09-26 NOTE — Patient Instructions (Signed)
Please see patient coordinator before you leave today  to schedule limited CT of sinuses  Pantoprazole (protonix) 40 mg   Take 30-60 min before first meal of the day and Pepcid 20 mg one bedtime until return to office - this is the best way to tell whether stomach acid is contributing to your problem.    GERD (REFLUX)  is an extremely common cause of respiratory symptoms, many times with no significant heartburn at all.    It can be treated with medication, but also with lifestyle changes including avoidance of late meals, excessive alcohol, smoking cessation, and avoid fatty foods, chocolate, peppermint, colas, red wine, and acidic juices such as orange juice.  NO MINT OR MENTHOL PRODUCTS SO NO COUGH DROPS  USE SUGARLESS CANDY INSTEAD (jolley ranchers or Stover's)  NO OIL BASED VITAMINS - use powdered substitutes.  Please schedule a follow up office visit in 4 weeks, sooner if needed and cxr on return

## 2013-09-27 ENCOUNTER — Other Ambulatory Visit: Payer: 59

## 2013-09-27 NOTE — Assessment & Plan Note (Signed)
-   first seen on plain cxr 07/10/13 vs prev neg study 04/2012 - See CT chest  09/24/13   DDx unfortunately does include "early" met ca but more likely this is some benign granulomatous or other inflammatory process but in case the nodules are tiny and multiple and very likely unrelated to her cough (discussed separately)  Discussed in detail all the  indications, usual  risks and alternatives  relative to the benefits with patient who agrees to proceed with conservative rx with cxr in one month and if def "macroscopic" enlargement then PET to help guide best place to attempt tissue dx.

## 2013-09-27 NOTE — Assessment & Plan Note (Addendum)
The most common causes of chronic cough in immunocompetent adults include the following: upper airway cough syndrome (UACS), previously referred to as postnasal drip syndrome (PNDS), which is caused by variety of rhinosinus conditions; (2) asthma; (3) GERD; (4) chronic bronchitis from cigarette smoking or other inhaled environmental irritants; (5) nonasthmatic eosinophilic bronchitis; and (6) bronchiectasis.   These conditions, singly or in combination, have accounted for up to 94% of the causes of chronic cough in prospective studies.   Other conditions have constituted no >6% of the causes in prospective studies These have included bronchogenic carcinoma, chronic interstitial pneumonia, sarcoidosis, left ventricular failure, ACEI-induced cough, and aspiration from a condition associated with pharyngeal dysfunction.    Chronic cough is often simultaneously caused by more than one condition. A single cause has been found from 38 to 82% of the time, multiple causes from 18 to 62%. Multiply caused cough has been the result of three diseases up to 42% of the time.       Most likely this is  Classic Upper airway cough syndrome, so named because it's frequently impossible to sort out how much is  CR/sinusitis with freq throat clearing (which can be related to primary GERD)   vs  causing  secondary (" extra esophageal")  GERD from wide swings in gastric pressure that occur with throat clearing, often  promoting self use of mint and menthol lozenges that reduce the lower esophageal sphincter tone and exacerbate the problem further in a cyclical fashion.   These are the same pts (now being labeled as having "irritable larynx syndrome" by some cough centers) who not infrequently have a history of having failed to tolerate ace inhibitors,  dry powder inhalers or biphosphonates or report having atypical reflux symptoms that don't respond to standard doses of PPI , and are easily confused as having aecopd or asthma  flares by even experienced allergists/ pulmonologists.  Will  Add ppi qam empircally and also   check sinus CT first (may shed light on the nodules if suggestive of something like WG) and then add 1st gen H1 to H2 at hs per guidelines

## 2013-09-29 ENCOUNTER — Encounter (HOSPITAL_COMMUNITY): Payer: Self-pay | Admitting: Surgery

## 2013-09-29 ENCOUNTER — Institutional Professional Consult (permissible substitution): Payer: 59 | Admitting: Internal Medicine

## 2013-09-30 ENCOUNTER — Ambulatory Visit (INDEPENDENT_AMBULATORY_CARE_PROVIDER_SITE_OTHER)
Admission: RE | Admit: 2013-09-30 | Discharge: 2013-09-30 | Disposition: A | Discharge status: Home / Self Care | Payer: Managed Care, Other (non HMO) | Source: Ambulatory Visit | Attending: Internal Medicine | Admitting: Internal Medicine

## 2013-09-30 ENCOUNTER — Encounter: Payer: Self-pay | Admitting: Internal Medicine

## 2013-09-30 DIAGNOSIS — R05 Cough: Secondary | ICD-10-CM

## 2013-09-30 NOTE — Progress Notes (Signed)
Quick Note:  Spoke with pt and notified of results per Dr. Wert. Pt verbalized understanding and denied any questions.  ______ 

## 2013-10-01 ENCOUNTER — Encounter (INDEPENDENT_AMBULATORY_CARE_PROVIDER_SITE_OTHER): Payer: Self-pay

## 2013-10-01 ENCOUNTER — Encounter (INDEPENDENT_AMBULATORY_CARE_PROVIDER_SITE_OTHER): Payer: Self-pay | Admitting: Surgery

## 2013-10-01 ENCOUNTER — Ambulatory Visit (INDEPENDENT_AMBULATORY_CARE_PROVIDER_SITE_OTHER): Payer: Private Health Insurance - Indemnity | Admitting: Surgery

## 2013-10-01 ENCOUNTER — Other Ambulatory Visit (INDEPENDENT_AMBULATORY_CARE_PROVIDER_SITE_OTHER): Payer: Self-pay

## 2013-10-01 DIAGNOSIS — Z9884 Bariatric surgery status: Secondary | ICD-10-CM

## 2013-10-01 DIAGNOSIS — K769 Liver disease, unspecified: Secondary | ICD-10-CM

## 2013-10-01 NOTE — Progress Notes (Signed)
Re:   Tracy Barry DOB:   Jul 26, 1966 MRN:   [id number]  ASSESSMENT AND PLAN: 1.  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  Will get labs before next visit.  She'll see me back in 3 months.  2.  Hypertension 3.  Diabetes mellitus x 4 years 4.  OSA - on CPAP 5.  GERD 6.  History of anxiety 7.  History of bipolar disease. 8.  CT scan 09/24/2013 with multiple pulmonary nodules.  Saw Dr. Judie Petit. Sherene Sires 09/26/2013.  To follow these up. 9.  MRI of liver 09/19/2013  Small subcapsular left hepatic lobe 10.  On chronic disability.  Chief Complaint  Patient presents with  . Bariatric Follow Up    1st p/o RNY   REFERRING PHYSICIAN: JARALLA SHAMLEFFER, IBTEHAL, MD  HISTORY OF PRESENT ILLNESS: Tracy Barry is a 47 y.o. (DOB: May 05, 1966)  AA  female whose primary care physician is [facility], IBTEHAL, MD and comes to me today for follow up of RYGB. She came by herself today. She is doing well. We went over her work up for her lungs and liver.  The liver seems resolved.  She is seeing Dr. Sherene Sires for her lungs and she is to be followed for this. She is eating well.  She is on protein.  She's had no nausea or vomiting and she is doing well with her bowels.  History of morbid obesity (Nov 2014): The patient has already been evaluated at [facility] by Dr. Mardi Mainland.  [employer] required a 4 month diet - which she has completed.  So, she has already been through the process once.  She said that Dr. Clent Ridges talked primarily about the sleeve gastrectomy, but now she wants to consider the RYGB.  She has also been to our information session. She is not sure who talked at it.  The patient has tried multiple diets including Weight Watchers at least 4 times, [employer], Doylene Bode diet, Nutrisystem diet, [employer], and the 17 day diet. She has tried Sensa based diet.  She has not tried a prescription pill. She  worked at [employer] and knew some people who had bariatric surgery who were successful with weight loss and some who were not.  Past Medical History  Diagnosis Date  . Complication of anesthesia     decreased sa02 after anesthesia  . Anxiety   . Diabetes mellitus without complication   . Headache(784.0)   . GERD (gastroesophageal reflux disease)     does not take medication to treat  . Congestion of upper airway   . Sleep apnea     cpap used  . Mental disorder     dx. Bipolar- tx. Lamictal  . Hypertension      Current Outpatient Prescriptions  Medication Sig Dispense Refill  . Chlorphen-Phenyleph-ASA (ALKA-SELTZER PLUS COLD PO) Take 1 tablet by mouth daily as needed (cold). For cold symptoms      . clonazePAM (KLONOPIN) 1 MG tablet Take 1 mg by mouth daily.      Marland Kitchen doxepin (SINEQUAN) 150 MG capsule Take 150 mg by mouth at bedtime.      . lamoTRIgine (LAMICTAL) 25 MG tablet Take 25 mg by mouth 2 (two) times daily.      . pantoprazole (PROTONIX) 40 MG tablet Take 1 tablet (40 mg total) by mouth daily. Take 30-60 min before first meal of the  day  30 tablet  2   No current facility-administered medications for this visit.    No Known Allergies  REVIEW OF SYSTEMS: Skin:  History of abdominoplasty in 2006. Infection:  No history of hepatitis or HIV.  No history of MRSA. Neurologic:  No history of stroke.  No history of seizure.  No history of headaches. Cardiac:  Hypertension. No history of heart disease.  No history of prior cardiac catheterization.  No history of seeing a cardiologist. Pulmonary:  Asthma.  OSA x 6 years, but on CPAP x 2 months.  Now (09/2013) with multiple pulmonary nodules by CT scan 09/24/2013 - seeing Dr. Jerilee Hoh.  Endocrine:  Diabetes mellitus x 4 years.. No thyroid disease. Gastrointestinal:  She does have some GERD..  No history of liver disease.  No history of gall bladder disease.  No history of pancreas disease.  No history of colon  disease. Urologic:  No history of kidney stones.  No history of bladder infections. GYN:  Hysterectomy (only) by Dr. Seymour Bars 10/17/2102. Psycho-social:  History of anxiety. History of bipolar disease - sees [facility] - Ocie Bob (prescribes)/Donna Agricultural engineer (counselor).  Saw Dr. Virgia Land 03/27/2013.  SOCIAL and FAMILY HISTORY: Unmarried. Lives with brother. On disability x 1 year from [facility] for bipolar/anxiety.  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  PHYSICAL EXAM: BP 128/92  Pulse 92  Temp(Src) 99 F (37.2 C) (Temporal)  Resp 15  Ht 5\' 4"  (1.626 m)  Wt 234 lb (106.142 kg)  BMI 40.15 kg/m2  LMP 10/01/2012  General: Obese AA female who is alert and generally healthy appearing.  HEENT: Normal. Pupils equal. Lungs: Clear to auscultation and symmetric breath sounds. Heart:  RRR. No murmur or rub. Abdomen: Soft. No mass. No tenderness.  Normal bowel sounds.  Has scar from abdominoplasty about 8 years ago.  Laparoscopic incision scars look good.  DATA REVIEWED: Recent x-rays and notes from Dr. Sherene Sires.  Ovidio Kin, MD,  [facility] [address] [place]  [address]   [place], [place]    [address] Phone:  [phone] FAX:  [phone]

## 2013-10-14 ENCOUNTER — Telehealth: Payer: Self-pay | Admitting: Internal Medicine

## 2013-10-14 ENCOUNTER — Encounter (INDEPENDENT_AMBULATORY_CARE_PROVIDER_SITE_OTHER): Payer: Self-pay

## 2013-10-14 NOTE — Telephone Encounter (Signed)
Spoke with pt. She reports in her [facility] she is not able to see her liver scan that was done. I advised her I do not show we ordered this. Pt PCP did. I advised her this is not showing in our charts and if she didn;t have this done within the [facility] system or [facility] then it would not show in her mYCHART. She needed nothing further

## 2013-10-14 NOTE — Telephone Encounter (Signed)
Returning call can be reached at [phone].Tracy Barry

## 2013-10-14 NOTE — Telephone Encounter (Signed)
[id number] x1 

## 2013-10-16 ENCOUNTER — Ambulatory Visit (INDEPENDENT_AMBULATORY_CARE_PROVIDER_SITE_OTHER): Payer: Private Health Insurance - Indemnity | Admitting: Surgery

## 2013-10-24 ENCOUNTER — Encounter: Payer: Self-pay | Admitting: Internal Medicine

## 2013-10-24 ENCOUNTER — Ambulatory Visit (INDEPENDENT_AMBULATORY_CARE_PROVIDER_SITE_OTHER)
Admission: RE | Admit: 2013-10-24 | Discharge: 2013-10-24 | Disposition: A | Discharge status: Home / Self Care | Payer: Managed Care, Other (non HMO) | Source: Ambulatory Visit | Attending: Internal Medicine | Admitting: Internal Medicine

## 2013-10-24 ENCOUNTER — Ambulatory Visit (INDEPENDENT_AMBULATORY_CARE_PROVIDER_SITE_OTHER): Payer: Managed Care, Other (non HMO) | Admitting: Internal Medicine

## 2013-10-24 VITALS — BP 126/84 | HR 94 | Temp 98.0°F | Ht 64.0 in | Wt 225.0 lb

## 2013-10-24 DIAGNOSIS — R918 Other nonspecific abnormal finding of lung field: Secondary | ICD-10-CM

## 2013-10-24 DIAGNOSIS — R911 Solitary pulmonary nodule: Secondary | ICD-10-CM

## 2013-10-24 DIAGNOSIS — R05 Cough: Secondary | ICD-10-CM

## 2013-10-24 DIAGNOSIS — R059 Cough, unspecified: Secondary | ICD-10-CM

## 2013-10-24 DIAGNOSIS — R058 Other specified cough: Secondary | ICD-10-CM

## 2013-10-24 NOTE — Assessment & Plan Note (Signed)
-   Sinus CT 09/30/2013 > no acute dz  Much better on gerd rx, no assoc sob.    Each maintenance medication was reviewed in detail including most importantly the difference between maintenance and as needed and under what circumstances the prns are to be used.  Please see instructions for details which were reviewed in writing and the patient given a copy.

## 2013-10-24 NOTE — Progress Notes (Signed)
Subjective:    Patient ID: Tracy Barry, female    DOB: 05-25-1966  MRN: [id number]   Brief patient profile:  41 yobf never smoker with onset  cough x late 2013 being considered for gastric bypass  Sep 09 2013 and had abn cxr /CT and referred to pulmonary 09/26/2013 by Dr Kelton Pillar    History of Present Illness  09/26/2013 1st [facility] office visit/ Tracy Barry cc indolent onset cough x one year waxes and wanes s pattern > clears back of throat and up a tbsp white to yellow to brown mucus never bloody   most days wakes up at 3 am and clears throat and back to bed.  rec  schedule limited CT of sinuse> neg  Pantoprazole (protonix) 40 mg   Take 30-60 min before first meal of the day and Pepcid 20 mg one bedtime until return to office - this is the best way to tell whether stomach acid is contributing to your problem.   GERD diet F/u with plain cxr's for now   10/24/2013 f/u ov/Tracy Barry re: chronic cough/ mpn  Chief Complaint  Patient presents with  . Follow-up    f/up with cxr prior.  Pt has no breathing complaints at this time.     Min am cough, much better since rx  Says she is to date on mammo's / gyn health maint/ no RA or connective tissue dz hx   No obvious day to day or daytime variabilty or assoc chronic cough or cp or chest tightness, subjective wheeze overt sinus or hb symptoms. No unusual exp hx or h/o childhood pna/ asthma or knowledge of premature birth.  Sleeping ok without nocturnal  or early am exacerbation  of respiratory  c/o's or need for noct saba. Also denies any obvious fluctuation of symptoms with weather or environmental changes or other aggravating or alleviating factors except as outlined above   Current Medications, Allergies, Complete Past Medical History, Past Surgical History, Family History, and Social History were reviewed in [employer] record.  ROS  The following are not active complaints unless bolded sore throat,  dysphagia, dental problems, itching, sneezing,  nasal congestion or excess/ purulent secretions, ear ache,   fever, chills, sweats, unintended wt loss, pleuritic or exertional cp, hemoptysis,  orthopnea pnd or leg swelling, presyncope, palpitations, heartburn, abdominal pain, anorexia, nausea, vomiting, diarrhea  or change in bowel or urinary habits, change in stools or urine, dysuria,hematuria,  rash, arthralgias, visual complaints, headache, numbness weakness or ataxia or problems with walking or coordination,  change in mood/affect or memory.                Objective:   Physical Exam  10/24/2013       225  Wt Readings from Last 3 Encounters:  09/26/13 234 lb (106.142 kg)  09/10/13 248 lb (112.492 kg)  09/10/13 248 lb (112.492 kg)      amb bf nad  HEENT: nl dentition, turbinates, and orophanx. Nl external ear canals without cough reflex   NECK :  without JVD/Nodes/TM/ nl carotid upstrokes bilaterally   LUNGS: no acc muscle use, clear to A and P bilaterally without cough on insp or exp maneuvers   CV:  RRR  no s3 or murmur or increase in P2, no edema   ABD:  soft and nontender with nl excursion in the supine position. No bruits or organomegaly, bowel sounds nl  MS:  warm without deformities, calf tenderness, cyanosis or clubbing  SKIN:  warm and dry without lesions           CT chest 09/24/13 1. Multiple pulmonary nodules suspicious for pulmonary metastatic  disease. Recommend metastatic workup.  2. No findings for primary lung neoplasm or adenopathy in the chest   CXR  10/24/2013 : Cardiopericardial silhouette within normal limits. Mediastinal  contours normal. Trachea midline. No airspace disease or effusion.  Right midlung pulmonary nodule is unchanged compared to the prior  radiograph.  IMPRESSION:  No active cardiopulmonary disease.           Assessment & Plan:

## 2013-10-24 NOTE — Patient Instructions (Addendum)
Please schedule a follow up visit in 3 months but call sooner if needed with cxr

## 2013-10-24 NOTE — Assessment & Plan Note (Signed)
-   first seen on plain cxr 07/10/13 vs prev neg study 04/2012 - See CT chest  09/24/13   I had an extended discussion with the patient today lasting 15 to 20 minutes of a 25 minute visit on the following issues  Since they are mulitiple and too small for PET, only option would be vats bx which seems a bit aggressive for what is in all likelihood a benign process.  Even if were ca, it would not be in an early or curable stage at this point.    Discussed in detail all the  indications, usual  risks and alternatives  relative to the benefits with patient who agrees to proceed with conservative f/u with serial cxr's

## 2013-11-04 ENCOUNTER — Encounter: Payer: 59 | Attending: Surgery | Admitting: Dietician

## 2013-11-04 DIAGNOSIS — Z713 Dietary counseling and surveillance: Secondary | ICD-10-CM | POA: Diagnosis not present

## 2013-11-04 NOTE — Patient Instructions (Addendum)
Goals:  Follow Phase 3B: High Protein + Non-Starchy Vegetables  Eat 3-6 small meals/snacks, every 3-5 hrs  Increase lean protein foods to meet 60g goal, just have one protein shake per day  Increase fluid intake to 64oz +  Avoid drinking 15 minutes before, during and 30 minutes after eating  Continue having >30 min of physical activity daily

## 2013-11-04 NOTE — Progress Notes (Signed)
  Follow-up visit:  8 Weeks Post-Operative RYGB Surgery  Medical Nutrition Therapy:  Appt start time: 1115 end time:  1200.  Primary concerns today: Post-operative Bariatric Surgery Nutrition Management. Tracy Barry returns with a 19.5 lbs weight loss. Not tolerating beef or pork or anything fried. Eating a lot of salads, collard greens, and raw vegetables. States that she has insomnia. Has had some Doritos, popcorn, and coffee at nighttime. Will skip meals sometimes during the day. Exercising 90 minutes 5-6 x week.  Surgery date: 09/09/13  Surgery type: RYGB [facility]: 248 lbs 09/09/13  Weight today: 217.0 lbs Weight change: 19.5 lbs, 12 lbs fat loss Total weight lost: 31 lbs  TANITA  BODY COMP RESULTS  09/23/13 11/04/13   BMI (kg/m^2) 40.6 37.2   Fat Mass (lbs) 120.5 108.5   Fat Free Mass (lbs) 116.0 108.5   Total Body Water (lbs) 85.0 79.5    Preferred Learning Style:   No preference indicated   Learning Readiness:   Ready  24-hr recall: B (AM): Premier protein shake (30 g protein) during the week  Snk (AM): none L (PM): salad with 2-3 oz shrimp or shake (14-30 g) Snk (PM):  Carrots, celery, ,tomato with veggie dip (210 cal/3 oz) D (PM): salad or 3 oz grilled chicken thigh, fish, (21 g) Snk (PM): Doritos, popcorn  Fluid intake: 16-32 oz of water, 2 protein shakes 38-54 oz fluid Estimated total protein intake: 70-95 g per day  Medications: No longer taking diabetes medication Supplementation: taking   CBG monitoring: 2 x day Average CBG per patient: 92-95 mg/dl  Last patient reported A1c: 6.4% in 06/2013  Using straws: No   Drinking while eating: No Hair loss: Yes - added biotin Carbonated beverages: No N/V/D/C: Nausea and vomiting with beef, pork, fried foods, having some constipation and taking Dulcolax  Dumping syndrome: No  Recent physical activity:  For past 3 weeks 90 minutes of cardio or walking on track and crunches 5-6 x week   Progress  Towards Goal(s):  In progress.  Handouts given during visit include:  Phase 3B High Protein + Non Starchy Vegetables   Nutritional Diagnosis:  [name] Overweight/obesity related to past poor dietary habits and physical inactivity as evidenced by patient w/ recent RYGB surgery following dietary guidelines for continued weight loss.    Intervention:  Nutrition education/diet advancement.  Teaching Method Utilized:  Visual Auditory   Barriers to learning/adherence to lifestyle change: craving carbs, not sleeping well, stressed  Demonstrated degree of understanding via:  Teach Back   Monitoring/Evaluation:  Dietary intake, exercise, snd body weight. Follow up in 1 months for 3 month post-op visit.

## 2013-12-15 ENCOUNTER — Ambulatory Visit: Payer: Managed Care, Other (non HMO) | Admitting: Dietician

## 2014-01-22 ENCOUNTER — Ambulatory Visit: Payer: Managed Care, Other (non HMO) | Admitting: Internal Medicine

## 2014-01-28 ENCOUNTER — Encounter: Payer: Self-pay | Admitting: Internal Medicine

## 2014-01-28 ENCOUNTER — Ambulatory Visit (INDEPENDENT_AMBULATORY_CARE_PROVIDER_SITE_OTHER)
Admission: RE | Admit: 2014-01-28 | Discharge: 2014-01-28 | Disposition: A | Discharge status: Home / Self Care | Payer: 59 | Source: Ambulatory Visit | Attending: Internal Medicine | Admitting: Internal Medicine

## 2014-01-28 ENCOUNTER — Ambulatory Visit (INDEPENDENT_AMBULATORY_CARE_PROVIDER_SITE_OTHER): Payer: 59 | Admitting: Internal Medicine

## 2014-01-28 VITALS — BP 112/74 | HR 69 | Temp 97.7°F | Ht 64.0 in | Wt 192.0 lb

## 2014-01-28 DIAGNOSIS — R918 Other nonspecific abnormal finding of lung field: Secondary | ICD-10-CM

## 2014-01-28 NOTE — Patient Instructions (Signed)
Please schedule a follow up visit in 6 months but call sooner if needed (cough or short of breath)

## 2014-01-28 NOTE — Progress Notes (Signed)
Subjective:    Patient ID: Tracy Barry, female    DOB: [birth year]  MRN: [id number]   Brief patient profile:  60 yobf never smoker with onset  cough x late 2013 being considered for gastric bypass  Sep 09 2013 and had abn cxr /CT and referred to pulmonary 09/26/2013 by Dr Kelton Pillar    History of Present Illness  09/26/2013 1st [facility] office visit/ Tracy Barry cc indolent onset cough x one year waxes and wanes s pattern > clears back of throat and up a tbsp white to yellow to brown mucus never bloody   most days wakes up at 3 am and clears throat and back to bed.  rec  schedule limited CT of sinuse> neg  Pantoprazole (protonix) 40 mg   Take 30-60 min before first meal of the day and Pepcid 20 mg one bedtime until return to office - this is the best way to tell whether stomach acid is contributing to your problem.   GERD diet F/u with plain cxr's for now   10/24/2013 f/u ov/Tracy Barry re: chronic cough/ mpn  Chief Complaint  Patient presents with  . Follow-up    f/up with cxr prior.  Pt has no breathing complaints at this time.     Min am cough, much better since rx  Says she is to date on mammo's / gyn health maint/ no RA or connective tissue dz hx  rec Re check cxr in 3 m   01/28/2014 f/u ov/Tracy Barry re: MPN Chief Complaint  Patient presents with  . Followup with CXR    Pt states doing well and denies any co's today.    Able to work out, really feeling great  No obvious day to day or daytime variabilty or assoc chronic cough or cp or chest tightness, subjective wheeze overt sinus or hb symptoms. No unusual exp hx or h/o childhood pna/ asthma or knowledge of premature birth.  Sleeping ok without nocturnal  or early am exacerbation  of respiratory  c/o's or need for noct saba. Also denies any obvious fluctuation of symptoms with weather or environmental changes or other aggravating or alleviating factors except as outlined above   Current Medications, Allergies, Complete Past  Medical History, Past Surgical History, Family History, and Social History were reviewed in [employer] record.  ROS  The following are not active complaints unless bolded sore throat, dysphagia, dental problems, itching, sneezing,  nasal congestion or excess/ purulent secretions, ear ache,   fever, chills, sweats, unintended wt loss, pleuritic or exertional cp, hemoptysis,  orthopnea pnd or leg swelling, presyncope, palpitations, heartburn, abdominal pain, anorexia, nausea, vomiting, diarrhea  or change in bowel or urinary habits, change in stools or urine, dysuria,hematuria,  rash, arthralgias, visual complaints, headache, numbness weakness or ataxia or problems with walking or coordination,  change in mood/affect or memory.                Objective:   Physical Exam  10/24/2013       225  > 01/28/2014  192  Wt Readings from Last 3 Encounters:  09/26/13 234 lb (106.142 kg)  09/10/13 248 lb (112.492 kg)  09/10/13 248 lb (112.492 kg)      amb bf nad  HEENT: nl dentition, turbinates, and orophanx. Nl external ear canals without cough reflex   NECK :  without JVD/Nodes/TM/ nl carotid upstrokes bilaterally   LUNGS: no acc muscle use, clear to A and P bilaterally without cough on insp or  exp maneuvers   CV:  RRR  no s3 or murmur or increase in P2, no edema   ABD:  soft and nontender with nl excursion in the supine position. No bruits or organomegaly, bowel sounds nl  MS:  warm without deformities, calf tenderness, cyanosis or clubbing  SKIN: warm and dry without lesions           CT chest 09/24/13 1. Multiple pulmonary nodules suspicious for pulmonary metastatic  disease. Recommend metastatic workup.  2. No findings for primary lung neoplasm or adenopathy in the chest   CXR  01/28/2014 :  Pulmonary nodular lesions are less well seen than on prior chest CT. Note lesion seen on plain film originally not seen now             Assessment &  Plan:

## 2014-01-28 NOTE — Assessment & Plan Note (Signed)
-   first seen on plain cxr 07/10/13 vs prev neg study 04/2012 - See CT chest  09/24/13  - plain cxr 01/28/2014 neg for nodules   Discussed in detail all the  indications, usual  risks and alternatives  relative to the benefits with patient:  Ideally needs f/u ct to be complete but plain film in 6 months is just as reasonable given the neg pre test probability that this is anything significant

## 2014-01-29 NOTE — Progress Notes (Signed)
Quick Note:  Spoke with pt and notified of results per Dr. Wert. Pt verbalized understanding and denied any questions.  ______ 

## 2014-02-20 LAB — CBC WITH DIFFERENTIAL/PLATELET
Basophils Absolute: 0 10*3/uL (ref 0.0–0.1)
Basophils Relative: 0 % (ref 0–1)
Eosinophils Absolute: 0 10*3/uL (ref 0.0–0.7)
Eosinophils Relative: 1 % (ref 0–5)
HCT: 37.7 % (ref 36.0–46.0)
Hemoglobin: 12.5 g/dL (ref 12.0–15.0)
Lymphocytes Relative: 25 % (ref 12–46)
Lymphs Abs: 1.9 10*3/uL (ref 0.7–4.0)
MCH: 29.4 pg (ref 26.0–34.0)
MCHC: 33.2 g/dL (ref 30.0–36.0)
MCV: 88.7 fL (ref 78.0–100.0)
Monocytes Absolute: 0.5 10*3/uL (ref 0.1–1.0)
Monocytes Relative: 7 % (ref 3–12)
Neutro Abs: 5.1 10*3/uL (ref 1.7–7.7)
Neutrophils Relative %: 67 % (ref 43–77)
Platelets: 288 10*3/uL (ref 150–400)
RBC: 4.25 MIL/uL (ref 3.87–5.11)
RDW: 16.5 % — ABNORMAL HIGH (ref 11.5–15.5)
WBC: 7.6 10*3/uL (ref 4.0–10.5)

## 2014-02-20 LAB — COMPREHENSIVE METABOLIC PANEL
ALT: 43 U/L — ABNORMAL HIGH (ref 0–35)
AST: 20 U/L (ref 0–37)
Albumin: 4.2 g/dL (ref 3.5–5.2)
Alkaline Phosphatase: 97 U/L (ref 39–117)
BUN: 14 mg/dL (ref 6–23)
CO2: 22 mEq/L (ref 19–32)
Calcium: 9.7 mg/dL (ref 8.4–10.5)
Chloride: 108 mEq/L (ref 96–112)
Creat: 1.03 mg/dL (ref 0.50–1.10)
Glucose, Bld: 82 mg/dL (ref 70–99)
Potassium: 4.1 mEq/L (ref 3.5–5.3)
Sodium: 142 mEq/L (ref 135–145)
Total Bilirubin: 0.5 mg/dL (ref 0.2–1.2)
Total Protein: 6.8 g/dL (ref 6.0–8.3)

## 2014-02-20 LAB — HEMOGLOBIN A1C
Hgb A1c MFr Bld: 5.6 % (ref <5.7)
Mean Plasma Glucose: 114 mg/dL (ref <117)

## 2014-02-20 LAB — IRON AND TIBC
%SAT: 22 % (ref 20–55)
Iron: 75 ug/dL (ref 42–145)
TIBC: 340 ug/dL (ref 250–470)
UIBC: 265 ug/dL (ref 125–400)

## 2014-02-20 LAB — VITAMIN B12: Vitamin B-12: >2000 pg/mL — ABNORMAL HIGH (ref 211–911)

## 2014-02-20 LAB — MAGNESIUM: Magnesium: 2.2 mg/dL (ref 1.5–2.5)

## 2014-02-20 LAB — FOLATE: Folate: >20 ng/mL

## 2014-02-26 ENCOUNTER — Ambulatory Visit (INDEPENDENT_AMBULATORY_CARE_PROVIDER_SITE_OTHER): Payer: Private Health Insurance - Indemnity | Admitting: Surgery

## 2014-03-19 ENCOUNTER — Ambulatory Visit (INDEPENDENT_AMBULATORY_CARE_PROVIDER_SITE_OTHER): Payer: 59 | Admitting: Surgery

## 2014-04-03 ENCOUNTER — Ambulatory Visit (INDEPENDENT_AMBULATORY_CARE_PROVIDER_SITE_OTHER): Payer: 59 | Admitting: Surgery

## 2014-05-28 ENCOUNTER — Ambulatory Visit (INDEPENDENT_AMBULATORY_CARE_PROVIDER_SITE_OTHER): Payer: 59 | Admitting: Surgery

## 2014-07-03 ENCOUNTER — Ambulatory Visit (INDEPENDENT_AMBULATORY_CARE_PROVIDER_SITE_OTHER): Payer: 59 | Admitting: Surgery

## 2014-07-10 ENCOUNTER — Other Ambulatory Visit (HOSPITAL_COMMUNITY): Payer: Self-pay | Admitting: Internal Medicine

## 2014-07-10 DIAGNOSIS — K7689 Other specified diseases of liver: Secondary | ICD-10-CM

## 2014-07-14 ENCOUNTER — Ambulatory Visit (HOSPITAL_COMMUNITY)
Admission: RE | Admit: 2014-07-14 | Discharge: 2014-07-14 | Disposition: A | Discharge status: Home / Self Care | Payer: 59 | Source: Ambulatory Visit | Attending: Internal Medicine | Admitting: Internal Medicine

## 2014-07-14 DIAGNOSIS — K7689 Other specified diseases of liver: Secondary | ICD-10-CM | POA: Insufficient documentation

## 2014-07-27 ENCOUNTER — Encounter: Payer: Self-pay | Admitting: Internal Medicine

## 2014-07-27 ENCOUNTER — Ambulatory Visit (INDEPENDENT_AMBULATORY_CARE_PROVIDER_SITE_OTHER): Payer: 59 | Admitting: Internal Medicine

## 2014-07-27 ENCOUNTER — Ambulatory Visit (INDEPENDENT_AMBULATORY_CARE_PROVIDER_SITE_OTHER)
Admission: RE | Admit: 2014-07-27 | Discharge: 2014-07-27 | Disposition: A | Discharge status: Home / Self Care | Payer: 59 | Source: Ambulatory Visit | Attending: Internal Medicine | Admitting: Internal Medicine

## 2014-07-27 VITALS — BP 124/80 | HR 80 | Temp 98.6°F | Ht 64.0 in | Wt 177.4 lb

## 2014-07-27 DIAGNOSIS — R918 Other nonspecific abnormal finding of lung field: Secondary | ICD-10-CM

## 2014-07-27 NOTE — Assessment & Plan Note (Signed)
-   first seen on plain cxr 07/10/13 vs prev neg study 04/2012 - See CT chest  09/24/13  - plain cxr 01/28/2014 neg for nodules > repeat 07/27/2014 >> no changes > rec final f/u 07/2015 (tickle file)  Never smoker, very healthy now that she's lost wt, with no convincing "macroscopic" changes on cxr so reasonable to do f/u in 1 year unless new cough or unexplained sob  Discussed in detail all the  indications, usual  risks and alternatives  relative to the benefits with patient who agrees to proceed with conservative f/u as outlined

## 2014-07-27 NOTE — Patient Instructions (Addendum)
Please remember to go to the   x-ray department downstairs for your tests - we will call you with the results when they are available.  If no visible nodules now on your chest xray then no follow up needed unless you develop any unexplained breathing problems or cough

## 2014-07-27 NOTE — Progress Notes (Signed)
Subjective:    Patient ID: Tracy Barry, female    DOB: [birth year]  MRN: [id number]   Brief patient profile:  12 yobf never smoker with onset  cough x late 2013 being considered for gastric bypass  Sep 09 2013 and had abn cxr /CT and referred to pulmonary 09/26/2013 by Dr Kelton Pillar    History of Present Illness  09/26/2013 1st [facility] office visit/ Tracy Barry cc indolent onset cough x one year waxes and wanes s pattern > clears back of throat and up a tbsp white to yellow to brown mucus never bloody   most days wakes up at 3 am and clears throat and back to bed.  rec  schedule limited CT of sinuse> neg  Pantoprazole (protonix) 40 mg   Take 30-60 min before first meal of the day and Pepcid 20 mg one bedtime until return to office - this is the best way to tell whether stomach acid is contributing to your problem.   GERD diet F/u with plain cxr's for now   10/24/2013 f/u ov/Tracy Barry re: upper airway chronic cough/ mpn  Chief Complaint  Patient presents with  . Follow-up    f/up with cxr prior.  Pt has no breathing complaints at this time.    Min am cough, much better since rx  Says she is to date on mammo's / gyn health maint/ no RA or connective tissue dz hx  rec Re check cxr in 3 m   01/28/2014 f/u ov/Tracy Barry re: MPN Chief Complaint  Patient presents with  . Followup with CXR    Pt states doing well and denies any co's today.    Able to work out, really feeling great cxr s def changes > rec f/u 57m  07/27/2014 f/u ov/Tracy Barry re: MPN Chief Complaint  Patient presents with  . Follow-up    Pt states doing well and denies any co's today.    Not limited by breathing from desired activities    No obvious day to day or daytime variabilty or assoc chronic cough or cp or chest tightness, subjective wheeze overt sinus or hb symptoms. No unusual exp hx or h/o childhood pna/ asthma or knowledge of premature birth.  Sleeping ok without nocturnal  or early am exacerbation  of  respiratory  c/o's or need for noct saba. Also denies any obvious fluctuation of symptoms with weather or environmental changes or other aggravating or alleviating factors except as outlined above   Current Medications, Allergies, Complete Past Medical History, Past Surgical History, Family History, and Social History were reviewed in [employer] record.  ROS  The following are not active complaints unless bolded sore throat, dysphagia, dental problems, itching, sneezing,  nasal congestion or excess/ purulent secretions, ear ache,   fever, chills, sweats, unintended wt loss, pleuritic or exertional cp, hemoptysis,  orthopnea pnd or leg swelling, presyncope, palpitations, heartburn, abdominal pain, anorexia, nausea, vomiting, diarrhea  or change in bowel or urinary habits, change in stools or urine, dysuria,hematuria,  rash, arthralgias, visual complaints, headache, numbness weakness or ataxia or problems with walking or coordination,  change in mood/affect or memory.                Objective:   Physical Exam  10/24/2013       225  > 01/28/2014  192 > 07/27/2014  177  Wt Readings from Last 3 Encounters:  09/26/13 234 lb (106.142 kg)  09/10/13 248 lb (112.492 kg)  09/10/13 248 lb (112.492  kg)      amb bf nad  HEENT: nl dentition, turbinates, and orophanx. Nl external ear canals without cough reflex   NECK :  without JVD/Nodes/TM/ nl carotid upstrokes bilaterally   LUNGS: no acc muscle use, clear to A and P bilaterally without cough on insp or exp maneuvers   CV:  RRR  no s3 or murmur or increase in P2, no edema   ABD:  soft and nontender with nl excursion in the supine position. No bruits or organomegaly, bowel sounds nl  MS:  warm without deformities, calf tenderness, cyanosis or clubbing  SKIN: warm and dry without lesions           CT chest 09/24/13 1. Multiple pulmonary nodules suspicious for pulmonary metastatic  disease. Recommend metastatic  workup.  2. No findings for primary lung neoplasm or adenopathy in the chest   CXR  01/28/2014 :  Pulmonary nodular lesions are less well seen than on prior chest CT. Note lesion seen on plain film originally not seen now     CXR  07/27/2014 : No active cardiopulmonary disease. No change compared to prior chest x-ray of January 28, 2014.           Assessment & Plan:

## 2014-07-28 NOTE — Progress Notes (Signed)
Quick Note:  Spoke with pt and notified of results per Dr. Wert. Pt verbalized understanding and denied any questions.  ______ 

## 2014-08-05 ENCOUNTER — Other Ambulatory Visit: Payer: Self-pay | Admitting: Gastroenterology

## 2015-04-05 ENCOUNTER — Other Ambulatory Visit: Payer: Self-pay

## 2015-04-24 IMAGING — US US ABDOMEN COMPLETE
1 series · 13 of 25 positions shown · non-contrast
Comparison: None.

CLINICAL DATA: Morbid obesity. Pre-op evaluation for bariatric
surgery.

EXAM:
ULTRASOUND ABDOMEN COMPLETE

[Series 1: us abdomen complete · 13 of 116 slices shown]
[im 1/116]
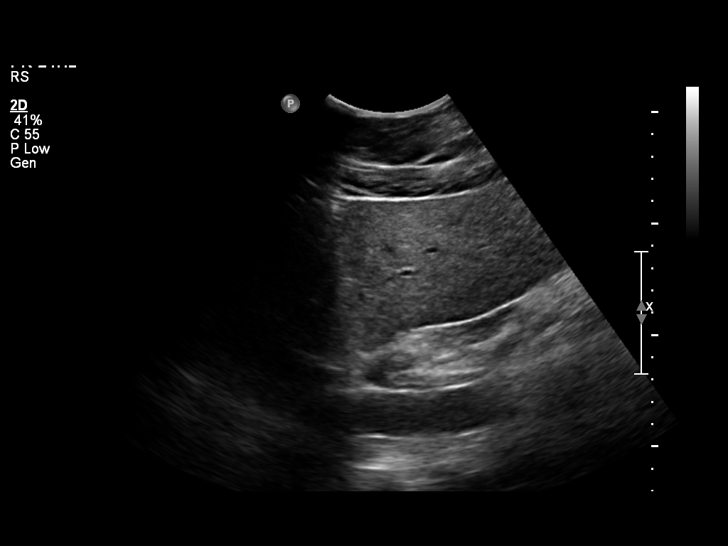
[im 10/116]
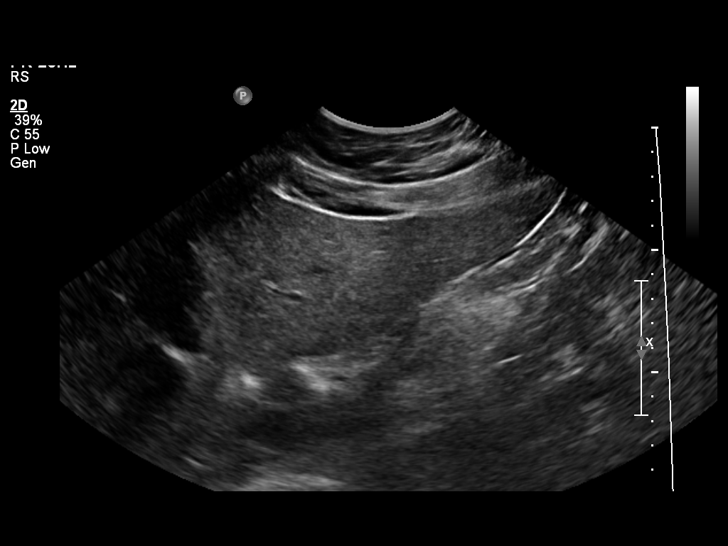
[im 20/116]
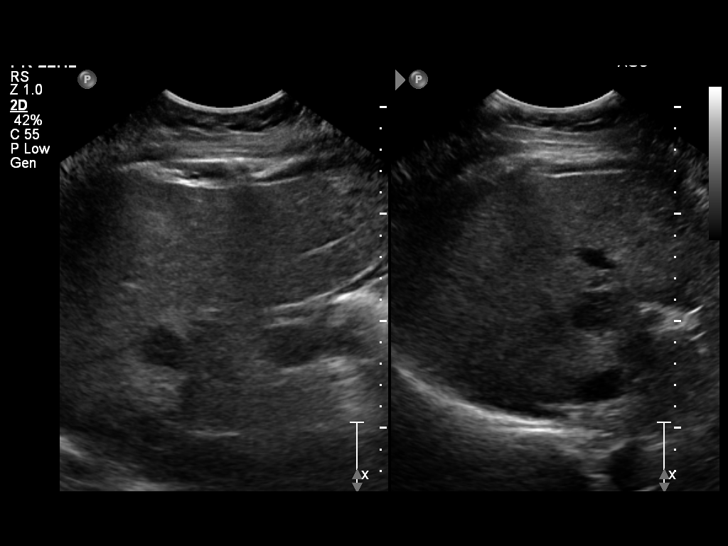
[im 29/116]
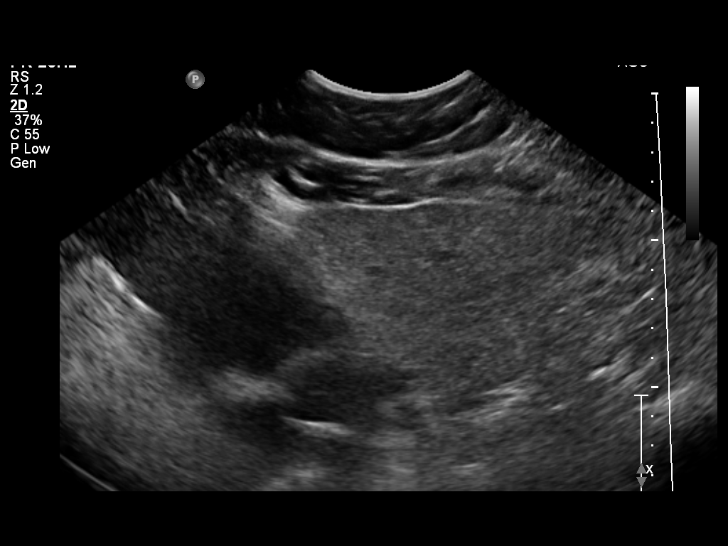
[im 39/116]
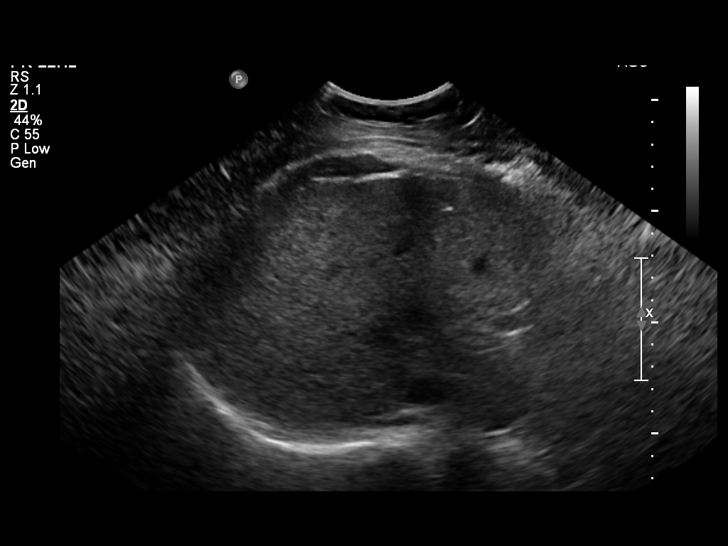
[im 48/116]
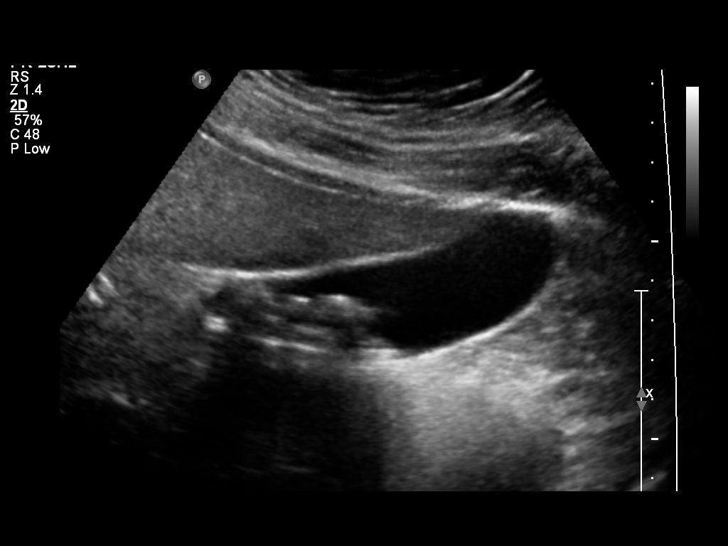
[im 58/116]
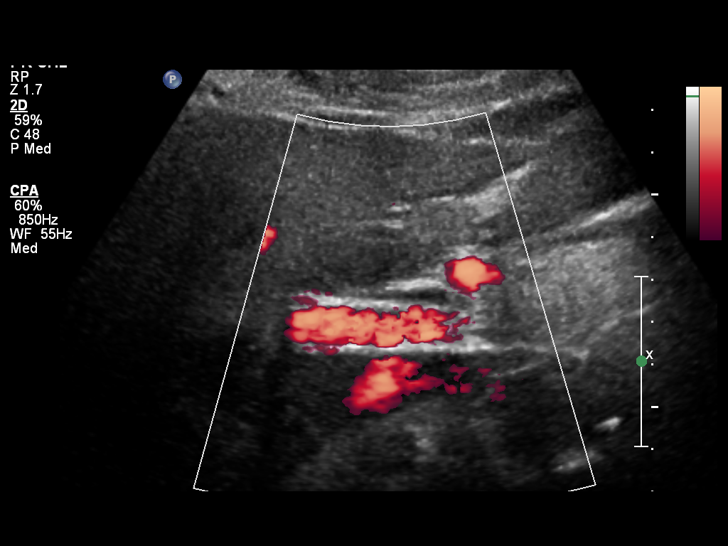
[im 68/116]
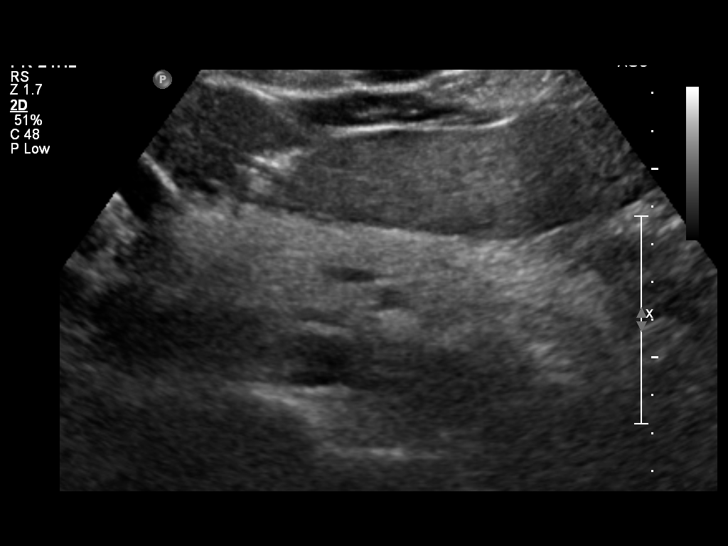
[im 77/116]
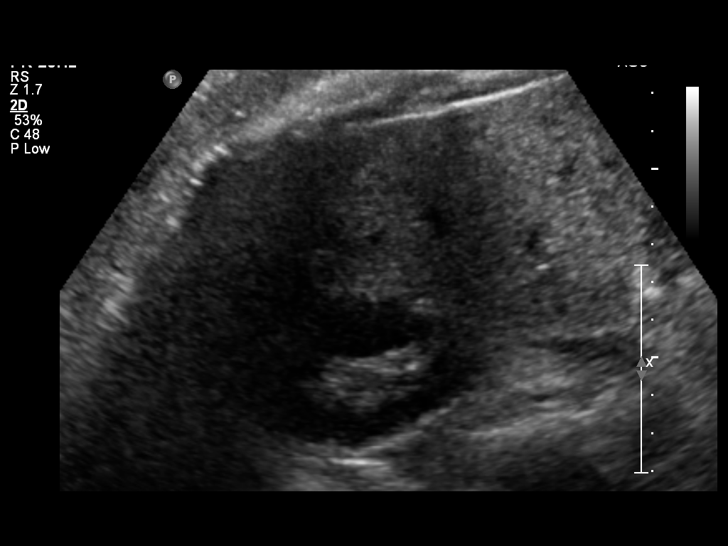
[im 87/116]
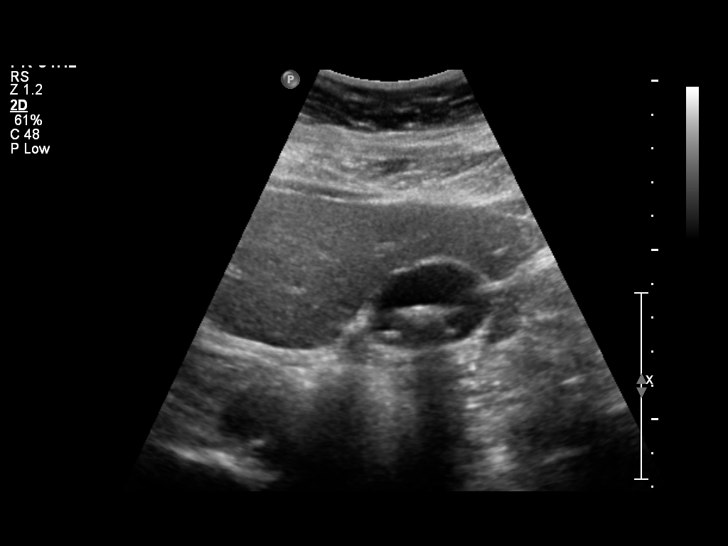
[im 96/116]
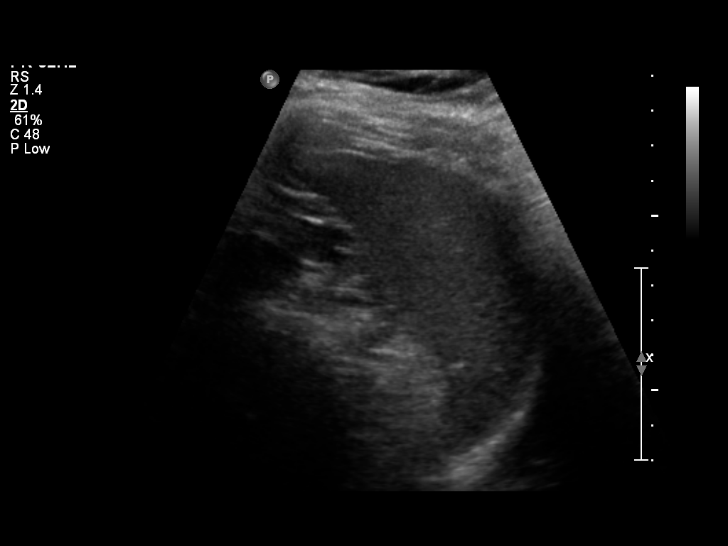
[im 106/116]
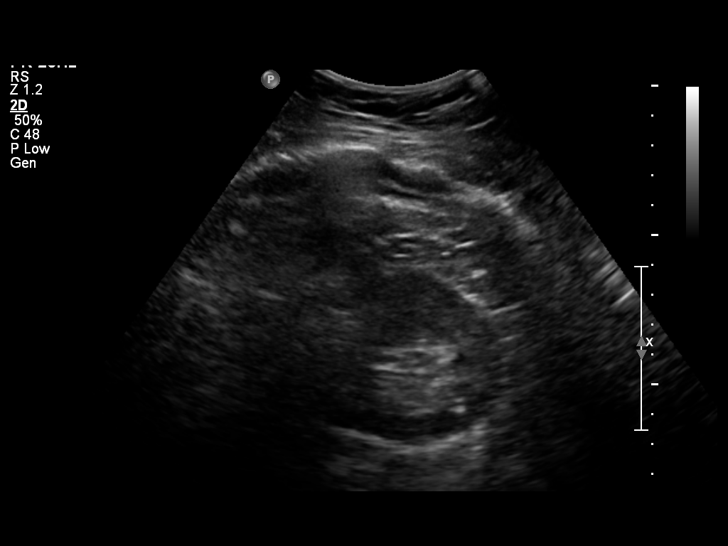
[im 116/116]
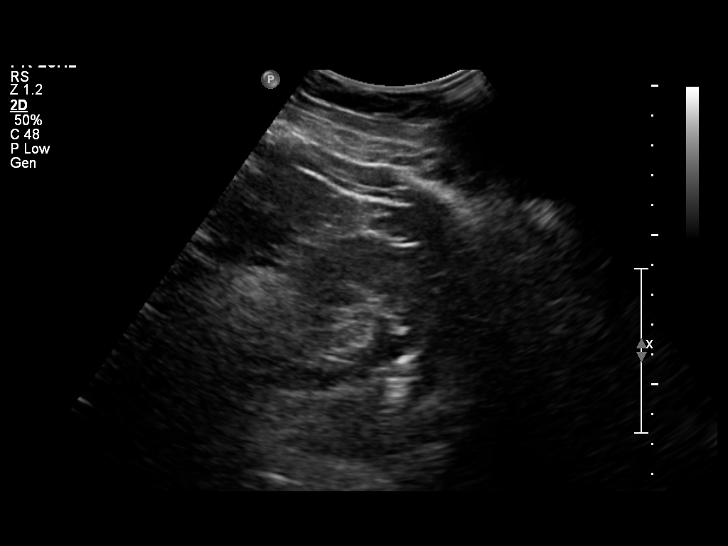

[13 of 25 positions shown; findings below may reference images not displayed]

FINDINGS: Gallbladder

No gallstones or wall thickening visualized. No sonographic Murphy
sign noted.

Common bile duct

Diameter: 4 mm.

Liver

Mild hepatic steatosis noted. There are also at least 3
well-circumscribed hypoechoic lesions seen in the right and left
hepatic lobes, largest measuring 2.5 cm. These do not have
characteristics of hepatic cysts and are suspicious for solid
masses.

IVC

No abnormality visualized.

Pancreas

Visualized portion unremarkable.

Spleen

Size and appearance within normal limits.

Right Kidney

Length: 12.3 cm. Echogenicity within normal limits. No mass or
hydronephrosis visualized.

Left Kidney

Length: 12.3 cm. Echogenicity within normal limits. No mass or
hydronephrosis visualized.

Abdominal aorta

No aneurysm visualized.
IMPRESSION: No evidence of gallstones or biliary ductal dilatation.

Hepatic steatosis.

At least 3 indeterminate hypoechoic liver masses, largest measuring
2.5 cm. Recommend further imaging characterization with abdomen MRI
without and with contrast as the preferred exam. (CT would be
appropriate if patient has contraindication to MRI or cannot
cooperate with breath-holding.)

## 2015-06-12 IMAGING — CR DG CHEST 2V
2 series · 2 of 2 positions shown · non-contrast
Comparison: Chest x-ray 04/12/2012.

CLINICAL DATA: Diabetes.  Hypertension.  Preop gastric bypass.

EXAM:
CHEST  2 VIEW

[w chest pa]
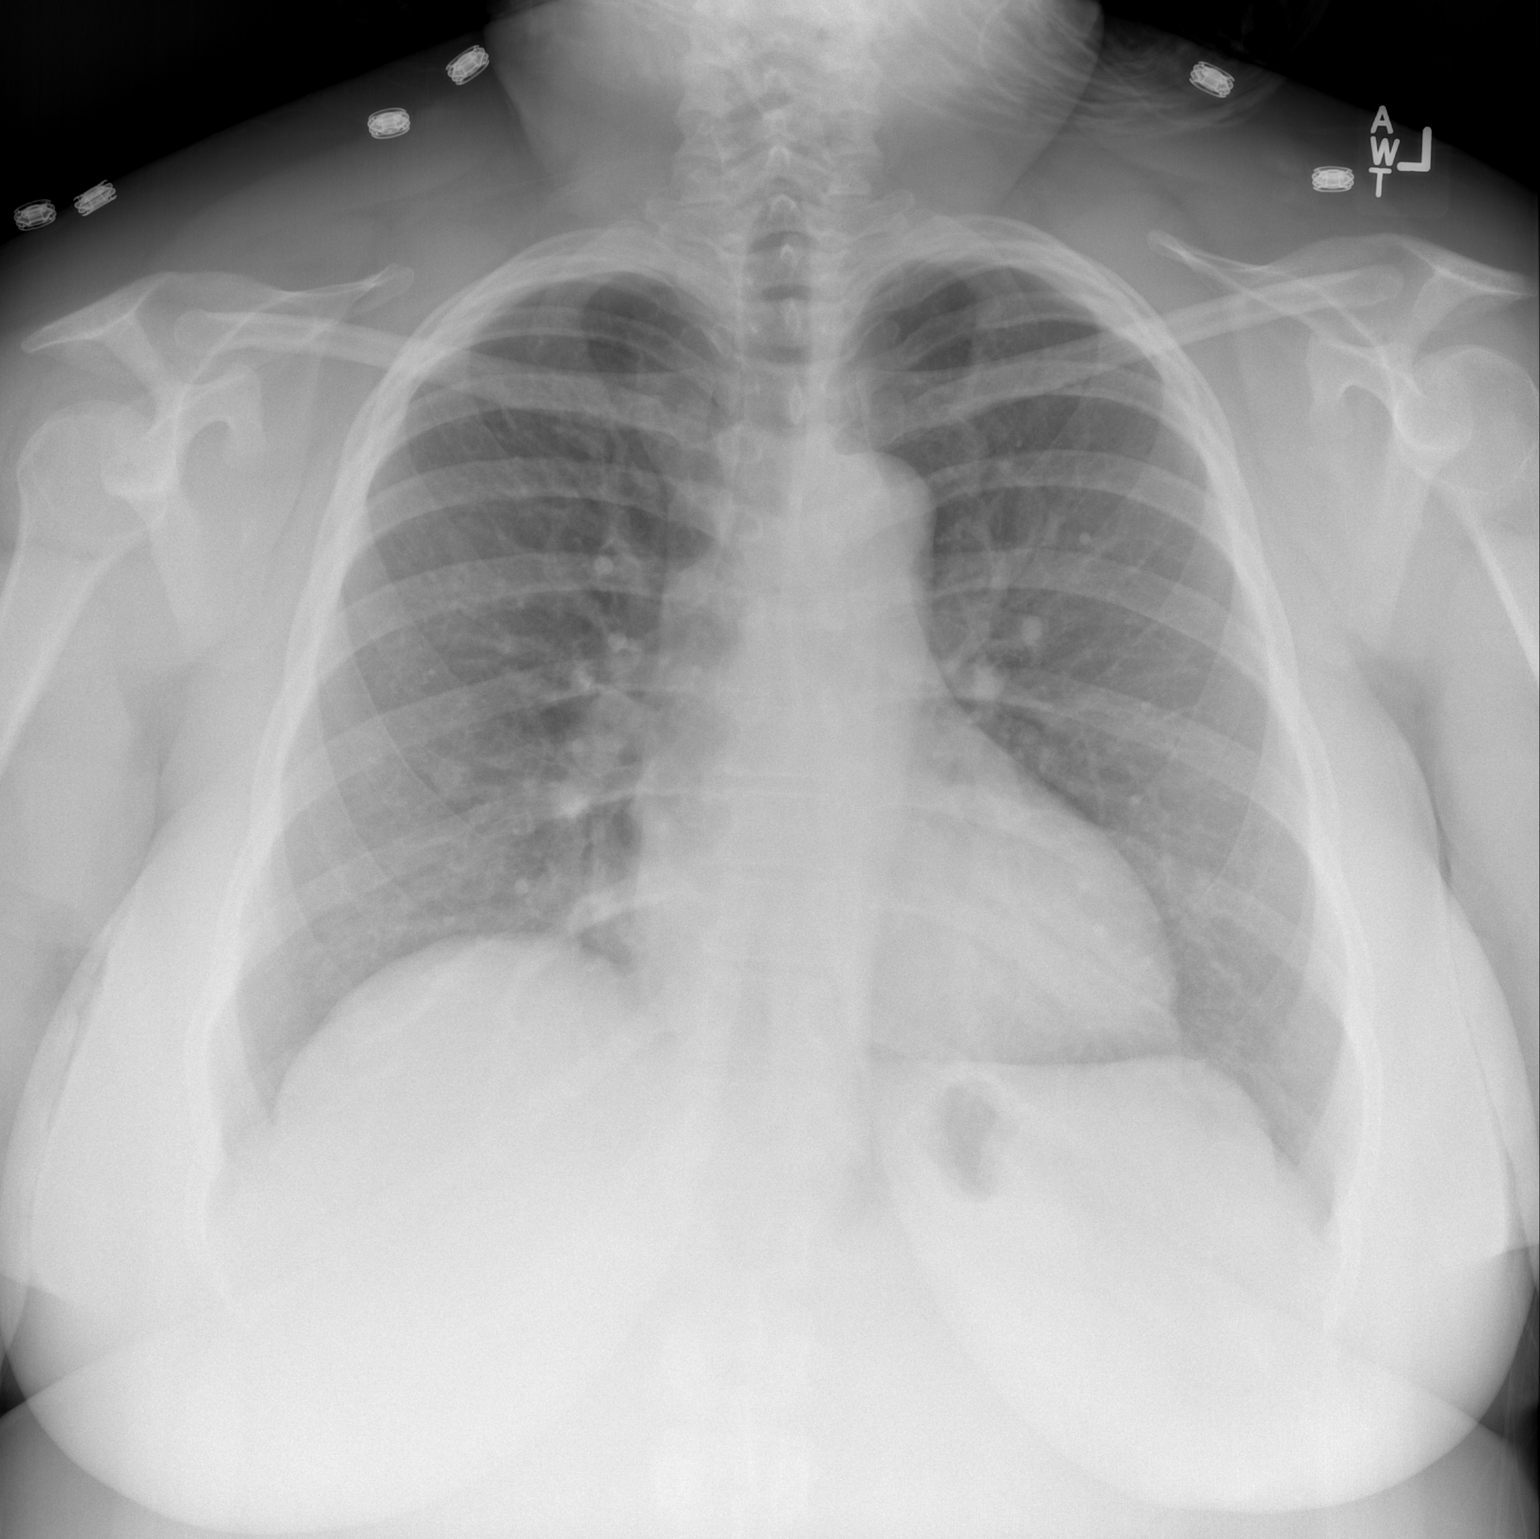

[w chest lat]
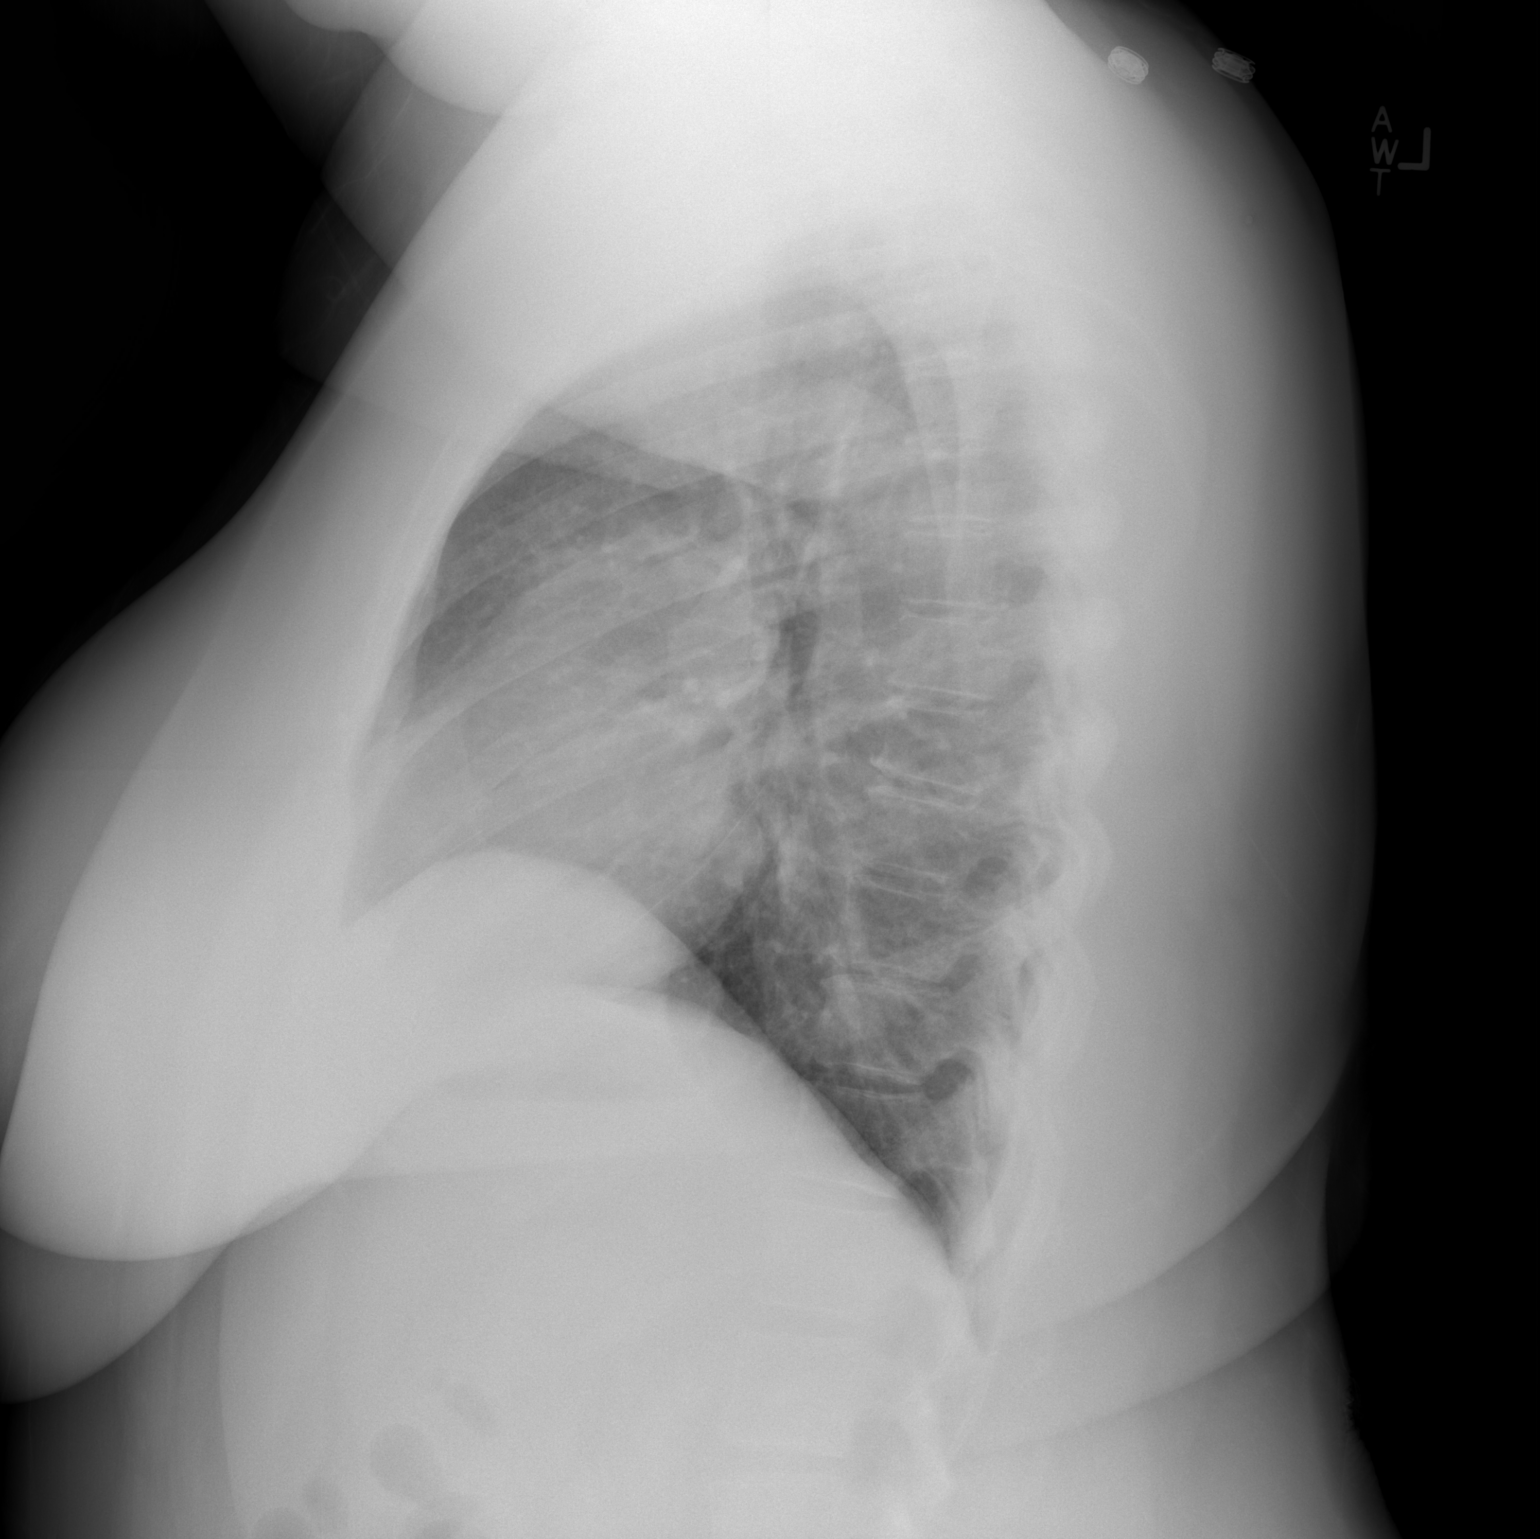

[2 of 2 positions shown; findings below may reference images not displayed]

FINDINGS: Mediastinum and hilar structures are normal. Questionable nodular
density noted projected over the right mid lung field on PA view
only. This is most likely overlapping structures . A repeat PA and
lateral chest x-ray suggested. Lungs are clear of infiltrates. Heart
size normal. No focal bony abnormality.
IMPRESSION: 1. Nodular density projected over right mid lung field most likely
overlapping structures. Repeat PA and lateral chest x-ray suggested.
2. No acute cardiopulmonary disease. These results will be called to
the ordering clinician or representative by the Radiologist
Assistant, and communication documented in the PACS Dashboard.

## 2015-07-13 ENCOUNTER — Telehealth: Payer: Self-pay | Admitting: *Deleted

## 2015-07-13 NOTE — Telephone Encounter (Signed)
Spoke with the pt and scheduled ov with MW to 08/04/15

## 2015-07-13 NOTE — Telephone Encounter (Signed)
-----   Message from Tanda Rockers, MD sent at 07/27/2014  3:51 PM EDT ----- F/u final ov/ cxr due

## 2015-08-04 ENCOUNTER — Ambulatory Visit: Payer: 59 | Admitting: Internal Medicine

## 2015-08-11 ENCOUNTER — Ambulatory Visit (INDEPENDENT_AMBULATORY_CARE_PROVIDER_SITE_OTHER): Payer: PPO | Admitting: Internal Medicine

## 2015-08-11 ENCOUNTER — Encounter: Payer: Self-pay | Admitting: Internal Medicine

## 2015-08-11 ENCOUNTER — Ambulatory Visit (INDEPENDENT_AMBULATORY_CARE_PROVIDER_SITE_OTHER)
Admission: RE | Admit: 2015-08-11 | Discharge: 2015-08-11 | Disposition: A | Discharge status: Home / Self Care | Payer: PPO | Source: Ambulatory Visit | Attending: Internal Medicine | Admitting: Internal Medicine

## 2015-08-11 VITALS — BP 130/90 | HR 90 | Ht 64.0 in | Wt 181.0 lb

## 2015-08-11 DIAGNOSIS — I10 Essential (primary) hypertension: Secondary | ICD-10-CM | POA: Diagnosis not present

## 2015-08-11 DIAGNOSIS — R918 Other nonspecific abnormal finding of lung field: Secondary | ICD-10-CM

## 2015-08-11 NOTE — Progress Notes (Signed)
Subjective:    Patient ID: Tracy Barry, female    DOB: [birth year]  MRN: [id number]   Brief patient profile:  48 yobf never smoker with onset  cough x late 2013 being considered for gastric bypass  Sep 09 2013 and had abn cxr /CT and referred to pulmonary 09/26/2013 by Dr Kelton Pillar    History of Present Illness  09/26/2013 1st [facility] office visit/ Wert cc indolent onset cough x one year waxes and wanes s pattern > clears back of throat and up a tbsp white to yellow to brown mucus never bloody   most days wakes up at 3 am and clears throat and back to bed.  rec  schedule limited CT of sinuse> neg  Pantoprazole (protonix) 40 mg   Take 30-60 min before first meal of the day and Pepcid 20 mg one bedtime until return to office - this is the best way to tell whether stomach acid is contributing to your problem.   GERD diet F/u with plain cxr's for now   10/24/2013 f/u ov/Wert re: upper airway chronic cough/ mpn  Chief Complaint  Patient presents with  . Follow-up    f/up with cxr prior.  Pt has no breathing complaints at this time.    Min am cough, much better since rx  Says she is to date on mammo's / gyn health maint/ no RA or connective tissue dz hx  rec Re check cxr in 3 m   01/28/2014 f/u ov/Wert re: MPN Chief Complaint  Patient presents with  . Followup with CXR    Pt states doing well and denies any co's today.    Able to work out, really feeling great cxr s def changes > rec f/u 70m  07/27/2014 f/u ov/Wert re: MPN Chief Complaint  Patient presents with  . Follow-up    Pt states doing well and denies any co's today.    rec F/u one year    08/11/2015  f/u ov/Wert re: MPBs  Chief Complaint  Patient presents with  . Follow-up    CXR done today. Pt reports she is doing well and denies any co's today.     Not limited by breathing from desired activities    No obvious day to day or daytime variabilty or assoc chronic cough or cp or chest tightness,  subjective wheeze overt sinus or hb symptoms. No unusual exp hx or h/o childhood pna/ asthma or knowledge of premature birth.  Sleeping ok without nocturnal  or early am exacerbation  of respiratory  c/o's or need for noct saba. Also denies any obvious fluctuation of symptoms with weather or environmental changes or other aggravating or alleviating factors except as outlined above   Current Medications, Allergies, Complete Past Medical History, Past Surgical History, Family History, and Social History were reviewed in [employer] record.  ROS  The following are not active complaints unless bolded sore throat, dysphagia, dental problems, itching, sneezing,  nasal congestion or excess/ purulent secretions, ear ache,   fever, chills, sweats, unintended wt loss, pleuritic or exertional cp, hemoptysis,  orthopnea pnd or leg swelling, presyncope, palpitations, heartburn, abdominal pain, anorexia, nausea, vomiting, diarrhea  or change in bowel or urinary habits, change in stools or urine, dysuria,hematuria,  rash, arthralgias, visual complaints, headache, numbness weakness or ataxia or problems with walking or coordination,  change in mood/affect or memory.                Objective:  Physical Exam  10/24/2013       225  > 01/28/2014  192 > 07/27/2014  177 > 08/11/2015   181  Wt Readings from Last 3 Encounters:  09/26/13 234 lb (106.142 kg)  09/10/13 248 lb (112.492 kg)  09/10/13 248 lb (112.492 kg)      amb bf nad - vital signs reviewed, note HBP  HEENT: nl dentition, turbinates, and orophanx. Nl external ear canals without cough reflex   NECK :  without JVD/Nodes/TM/ nl carotid upstrokes bilaterally   LUNGS: no acc muscle use, clear to A and P bilaterally without cough on insp or exp maneuvers   CV:  RRR  no s3 or murmur or increase in P2, no edema   ABD:  soft and nontender with nl excursion in the supine position. No bruits or organomegaly, bowel sounds  nl  MS:  warm without deformities, calf tenderness, cyanosis or clubbing  SKIN: warm and dry without lesions           CT chest 09/24/13 1. Multiple pulmonary nodules suspicious for pulmonary metastatic  disease. Recommend metastatic workup.  2. No findings for primary lung neoplasm or adenopathy in the chest   CXR  01/28/2014 :  Pulmonary nodular lesions are less well seen than on prior chest CT. Note lesion seen on plain film originally not seen now     CXR  07/27/2014 : No active cardiopulmonary disease. No change compared to prior chest x-ray of January 28, 2014.           Assessment & Plan:

## 2015-08-11 NOTE — Patient Instructions (Signed)
We will call you with radiologies final recs on whether or when you ever need any additional studies and see [place] back here as needed for cough or shortness of breath or pain on deep breath   Avoid salt and excess caffeine, decongestants and follow up with your primary doctor on your blood pressure as you plan  Pulmonary follow up is as needed

## 2015-08-12 ENCOUNTER — Encounter: Payer: Self-pay | Admitting: Internal Medicine

## 2015-08-12 ENCOUNTER — Telehealth: Payer: Self-pay | Admitting: *Deleted

## 2015-08-12 DIAGNOSIS — I10 Essential (primary) hypertension: Secondary | ICD-10-CM | POA: Insufficient documentation

## 2015-08-12 NOTE — Telephone Encounter (Signed)
-----   Message from Tanda Rockers, MD sent at 08/12/2015  6:53 AM EDT ----- Let pt know no f/u needed here, be sure to f/u with primary care re hbp

## 2015-08-12 NOTE — Assessment & Plan Note (Addendum)
-   first seen on plain cxr 07/10/13 vs prev neg study 04/2012 - See CT chest  09/24/13  - plain cxr 01/28/2014 neg for nodules > repeat 07/27/2014 >> no changes >  - 08/11/2015 no nodules viz > no f/u indicated    I had an extended final summary discussion with the patient reviewing all relevant studies including the actual images  completed to date and  lasting 15 to 20 minutes of a 25 minute visit on the following issues:    Since we could clearly see one of the nodules on plain cxr 2 years ago now and see none now it is safe to assume in this never smoker that the MPN are from a benign process >  No f/u needed

## 2015-08-12 NOTE — Telephone Encounter (Signed)
Spoke with the pt and notified of recs per MW  She verbalized understanding  Nothing further needed 

## 2015-08-12 NOTE — Assessment & Plan Note (Signed)
Admits to being under a lot of stress and not careful with diet / reviewed > Follow up per Primary Care planned

## 2015-10-31 IMAGING — CR DG CHEST 2V
2 series · 2 of 2 positions shown · non-contrast
Comparison: Chest CT September 24, 2013 and chest radiograph October 24, 2013

CLINICAL DATA: Prior pulmonary nodular lesions

EXAM:
CHEST  2 VIEW

[view not recorded (1 of 2)]
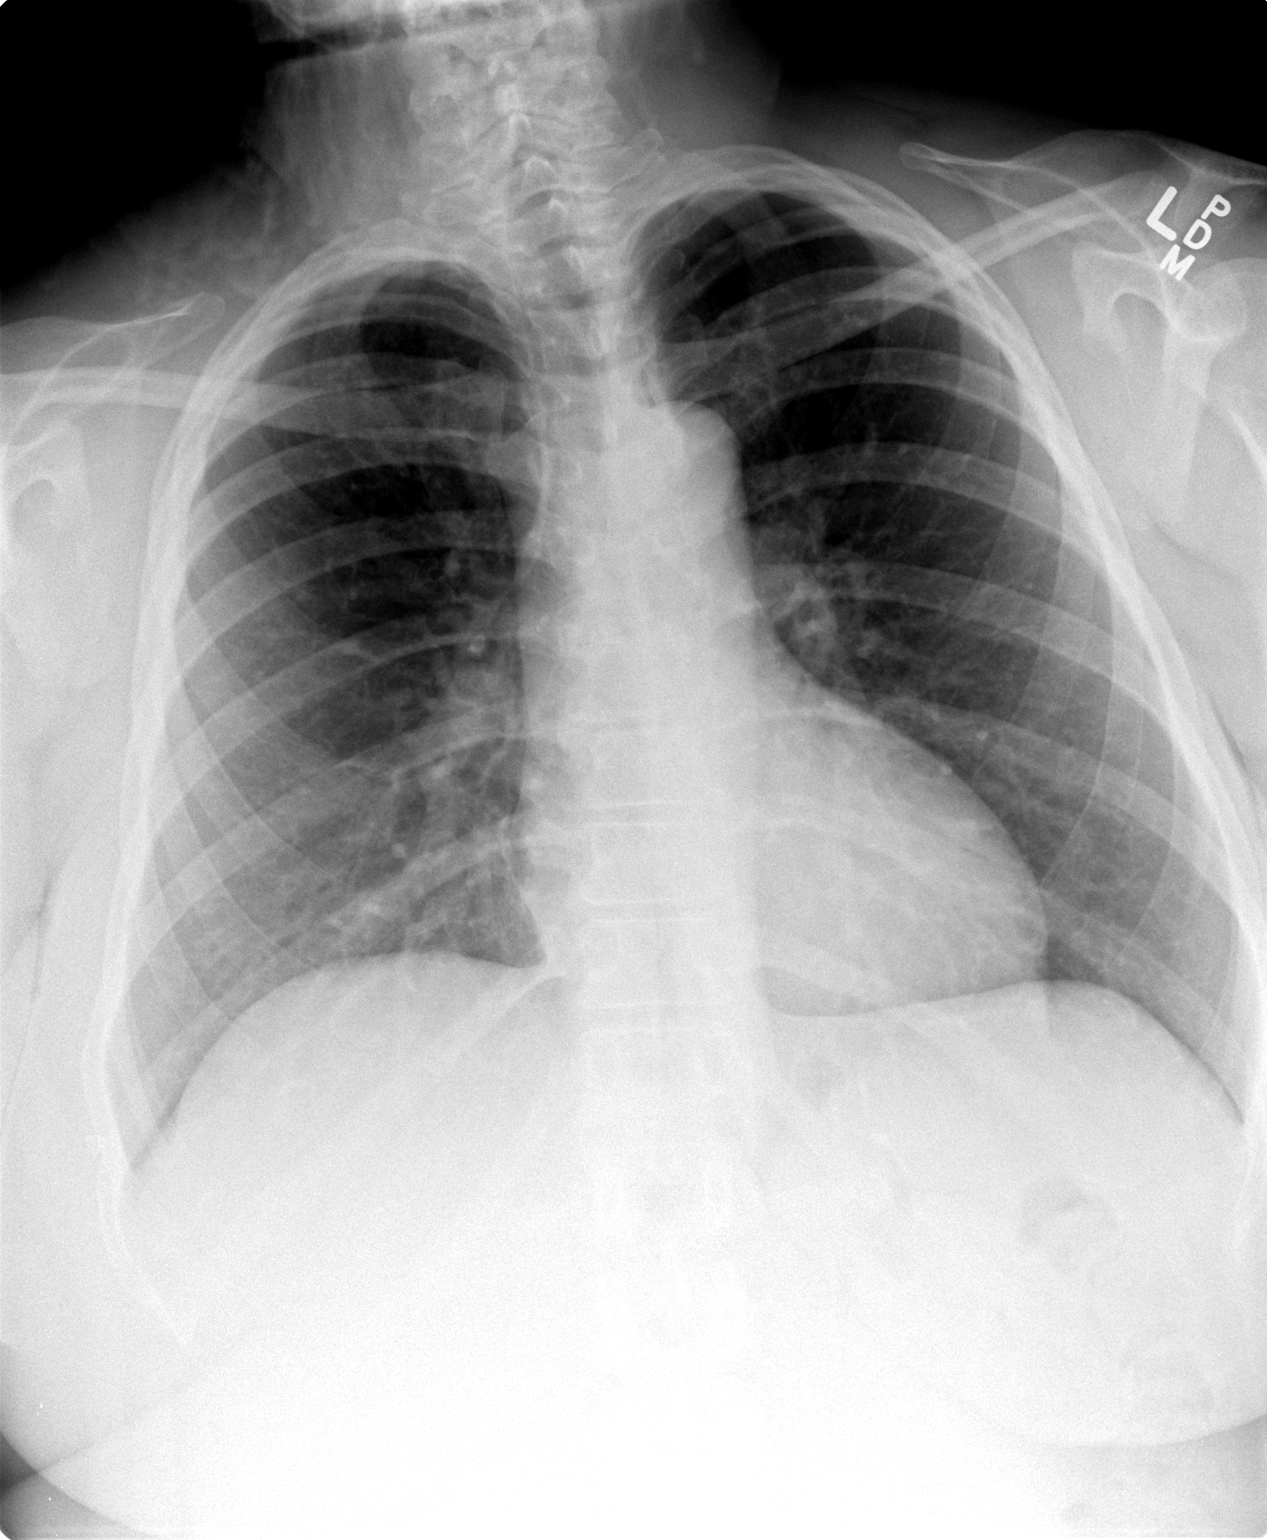

[view not recorded (2 of 2)]
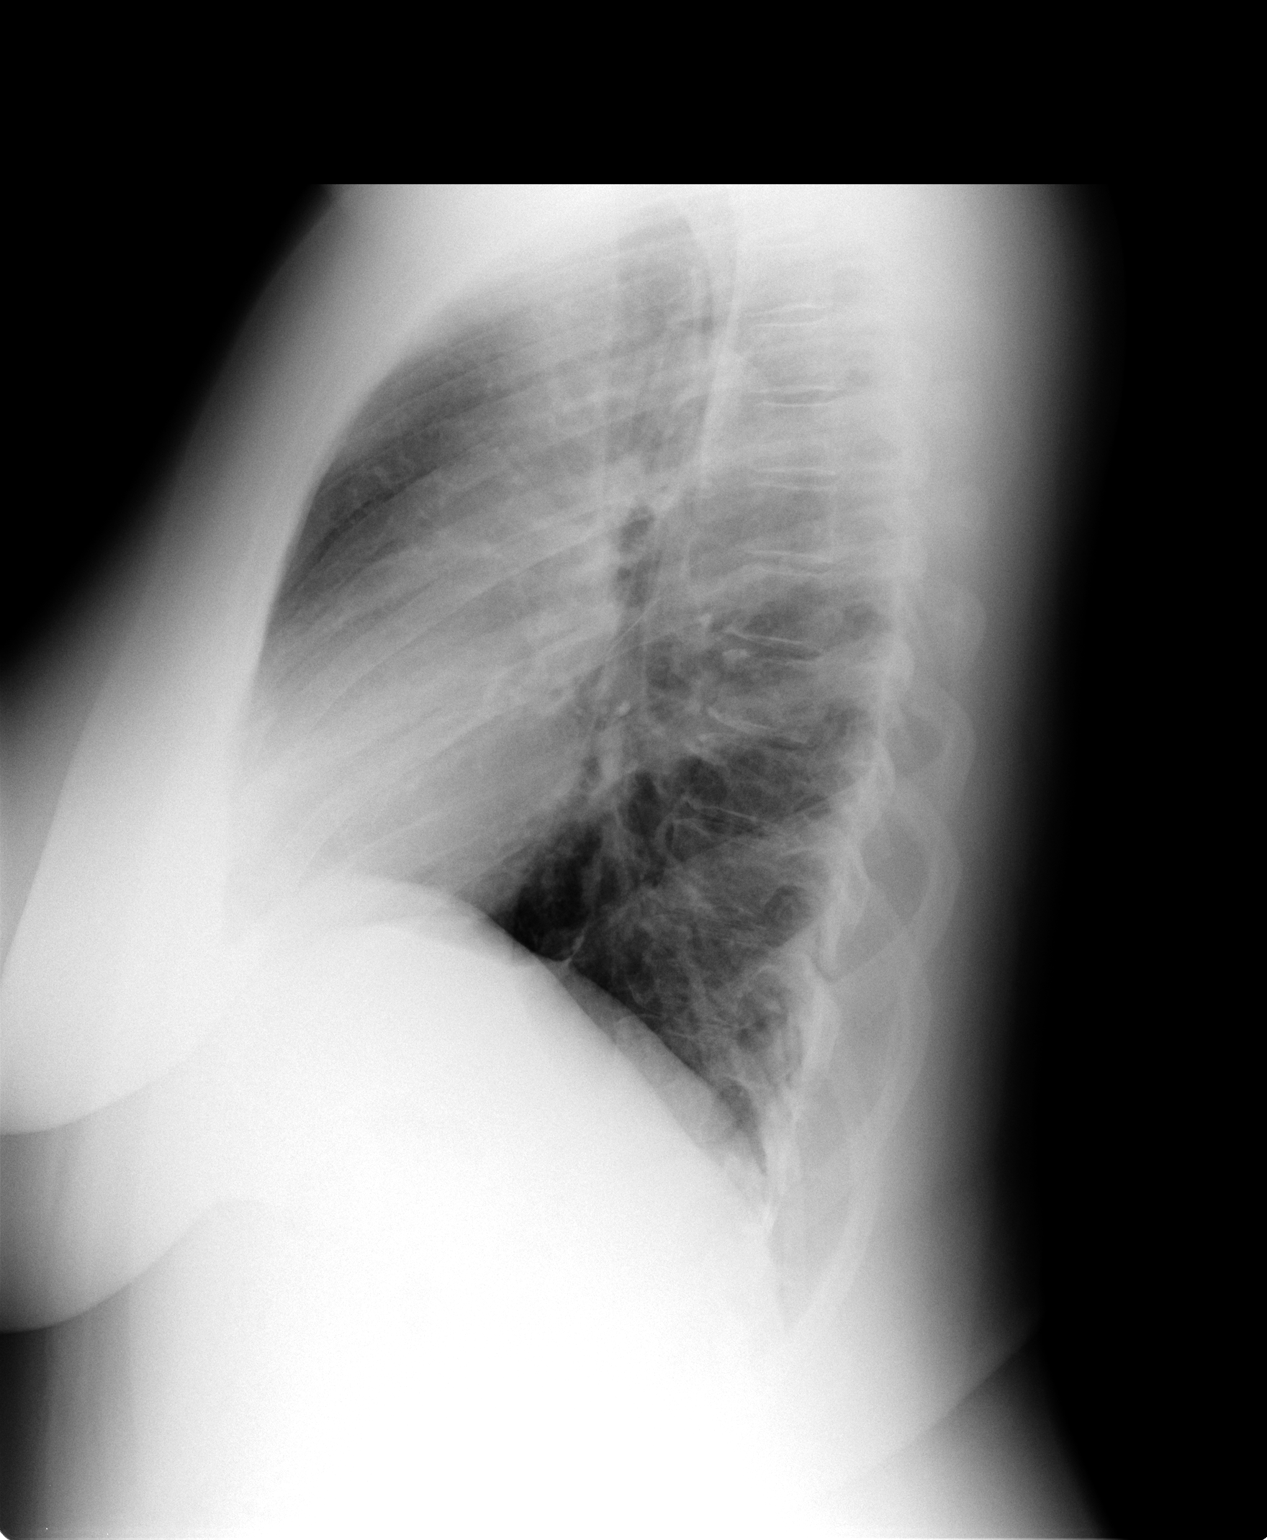

[2 of 2 positions shown; findings below may reference images not displayed]

FINDINGS: Pulmonary nodular lesions seen on prior CT are less well seen
currently on radiographic examination. There are several
subcentimeter opacities on the right, felt to represent small
pulmonary nodules. There does not appear to be an increase in
pulmonary nodular opacity compared to recent prior chest radiograph.
Elsewhere lungs are clear. Heart size and pulmonary vascularity are
normal. No adenopathy. No bone lesions.
IMPRESSION: Pulmonary nodular lesions are less well seen than on prior chest CT.
It may be prudent to obtain noncontrast enhanced chest CT to
directly compare pulmonary nodular lesions with the prior study.
There is no edema or consolidation. No adenopathy apparent.

## 2015-11-04 DIAGNOSIS — Z01419 Encounter for gynecological examination (general) (routine) without abnormal findings: Secondary | ICD-10-CM | POA: Diagnosis not present

## 2015-11-04 DIAGNOSIS — Z1151 Encounter for screening for human papillomavirus (HPV): Secondary | ICD-10-CM | POA: Diagnosis not present

## 2015-12-10 DIAGNOSIS — F4001 Agoraphobia with panic disorder: Secondary | ICD-10-CM | POA: Diagnosis not present

## 2015-12-10 DIAGNOSIS — F315 Bipolar disorder, current episode depressed, severe, with psychotic features: Secondary | ICD-10-CM | POA: Diagnosis not present

## 2015-12-10 DIAGNOSIS — F429 Obsessive-compulsive disorder, unspecified: Secondary | ICD-10-CM | POA: Diagnosis not present

## 2015-12-13 DIAGNOSIS — F319 Bipolar disorder, unspecified: Secondary | ICD-10-CM | POA: Diagnosis not present

## 2015-12-13 DIAGNOSIS — Z0000 Encounter for general adult medical examination without abnormal findings: Secondary | ICD-10-CM | POA: Diagnosis not present

## 2015-12-13 DIAGNOSIS — K8020 Calculus of gallbladder without cholecystitis without obstruction: Secondary | ICD-10-CM | POA: Diagnosis not present

## 2015-12-13 DIAGNOSIS — R7301 Impaired fasting glucose: Secondary | ICD-10-CM | POA: Diagnosis not present

## 2015-12-13 DIAGNOSIS — I10 Essential (primary) hypertension: Secondary | ICD-10-CM | POA: Diagnosis not present

## 2015-12-13 DIAGNOSIS — Z1389 Encounter for screening for other disorder: Secondary | ICD-10-CM | POA: Diagnosis not present

## 2015-12-16 DIAGNOSIS — F4001 Agoraphobia with panic disorder: Secondary | ICD-10-CM | POA: Diagnosis not present

## 2015-12-16 DIAGNOSIS — F315 Bipolar disorder, current episode depressed, severe, with psychotic features: Secondary | ICD-10-CM | POA: Diagnosis not present

## 2015-12-16 DIAGNOSIS — F429 Obsessive-compulsive disorder, unspecified: Secondary | ICD-10-CM | POA: Diagnosis not present

## 2015-12-22 DIAGNOSIS — F315 Bipolar disorder, current episode depressed, severe, with psychotic features: Secondary | ICD-10-CM | POA: Diagnosis not present

## 2015-12-22 DIAGNOSIS — F429 Obsessive-compulsive disorder, unspecified: Secondary | ICD-10-CM | POA: Diagnosis not present

## 2015-12-22 DIAGNOSIS — F4001 Agoraphobia with panic disorder: Secondary | ICD-10-CM | POA: Diagnosis not present

## 2015-12-23 DIAGNOSIS — Z713 Dietary counseling and surveillance: Secondary | ICD-10-CM | POA: Diagnosis not present

## 2015-12-23 DIAGNOSIS — Z6832 Body mass index (BMI) 32.0-32.9, adult: Secondary | ICD-10-CM | POA: Diagnosis not present

## 2015-12-23 DIAGNOSIS — E669 Obesity, unspecified: Secondary | ICD-10-CM | POA: Diagnosis not present

## 2016-02-09 DIAGNOSIS — F3131 Bipolar disorder, current episode depressed, mild: Secondary | ICD-10-CM | POA: Diagnosis not present

## 2016-02-09 DIAGNOSIS — F429 Obsessive-compulsive disorder, unspecified: Secondary | ICD-10-CM | POA: Diagnosis not present

## 2016-05-04 DIAGNOSIS — F3131 Bipolar disorder, current episode depressed, mild: Secondary | ICD-10-CM | POA: Diagnosis not present

## 2016-07-24 DIAGNOSIS — F429 Obsessive-compulsive disorder, unspecified: Secondary | ICD-10-CM | POA: Diagnosis not present

## 2016-07-24 DIAGNOSIS — F3131 Bipolar disorder, current episode depressed, mild: Secondary | ICD-10-CM | POA: Diagnosis not present

## 2016-09-20 DIAGNOSIS — Z1231 Encounter for screening mammogram for malignant neoplasm of breast: Secondary | ICD-10-CM | POA: Diagnosis not present

## 2016-09-20 DIAGNOSIS — Z803 Family history of malignant neoplasm of breast: Secondary | ICD-10-CM | POA: Diagnosis not present

## 2017-01-11 DIAGNOSIS — F3176 Bipolar disorder, in full remission, most recent episode depressed: Secondary | ICD-10-CM | POA: Diagnosis not present

## 2017-01-11 DIAGNOSIS — F4001 Agoraphobia with panic disorder: Secondary | ICD-10-CM | POA: Diagnosis not present

## 2017-04-12 ENCOUNTER — Encounter (HOSPITAL_COMMUNITY): Payer: Self-pay

## 2017-08-25 DIAGNOSIS — F3176 Bipolar disorder, in full remission, most recent episode depressed: Secondary | ICD-10-CM | POA: Diagnosis not present

## 2017-09-21 DIAGNOSIS — Z1231 Encounter for screening mammogram for malignant neoplasm of breast: Secondary | ICD-10-CM | POA: Diagnosis not present

## 2017-09-21 DIAGNOSIS — Z803 Family history of malignant neoplasm of breast: Secondary | ICD-10-CM | POA: Diagnosis not present

## 2018-03-14 DIAGNOSIS — F4001 Agoraphobia with panic disorder: Secondary | ICD-10-CM | POA: Diagnosis not present

## 2018-03-14 DIAGNOSIS — F3175 Bipolar disorder, in partial remission, most recent episode depressed: Secondary | ICD-10-CM | POA: Diagnosis not present

## 2018-09-27 DIAGNOSIS — Z803 Family history of malignant neoplasm of breast: Secondary | ICD-10-CM | POA: Diagnosis not present

## 2018-09-27 DIAGNOSIS — Z1231 Encounter for screening mammogram for malignant neoplasm of breast: Secondary | ICD-10-CM | POA: Diagnosis not present

## 2018-10-09 DEATH — deceased
